# Patient Record
Sex: Female | Born: 1990 | Race: White | Hispanic: No | Marital: Single | Smoking: Former smoker
Health system: Southern US, Community
[De-identification: ages and names within clinical notes are randomized; demographics above are authoritative.]

## PROBLEM LIST (undated history)

## (undated) DIAGNOSIS — D649 Anemia, unspecified: Secondary | ICD-10-CM

## (undated) DIAGNOSIS — F329 Major depressive disorder, single episode, unspecified: Secondary | ICD-10-CM

## (undated) DIAGNOSIS — K219 Gastro-esophageal reflux disease without esophagitis: Secondary | ICD-10-CM

## (undated) DIAGNOSIS — R87629 Unspecified abnormal cytological findings in specimens from vagina: Secondary | ICD-10-CM

## (undated) DIAGNOSIS — F32A Depression, unspecified: Secondary | ICD-10-CM

## (undated) DIAGNOSIS — F419 Anxiety disorder, unspecified: Secondary | ICD-10-CM

## (undated) HISTORY — PX: COLONOSCOPY: SHX174

## (undated) HISTORY — DX: Unspecified abnormal cytological findings in specimens from vagina: R87.629

## (undated) HISTORY — PX: WISDOM TOOTH EXTRACTION: SHX21

## (undated) HISTORY — PX: ESOPHAGOGASTRODUODENOSCOPY: SHX1529

---
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
## 2008-07-08 ENCOUNTER — Emergency Department: Payer: Self-pay | Admitting: Emergency Medicine

## 2010-03-30 ENCOUNTER — Emergency Department: Payer: Self-pay | Admitting: Emergency Medicine

## 2010-04-04 ENCOUNTER — Ambulatory Visit: Payer: Self-pay | Admitting: Obstetrics and Gynecology

## 2010-04-09 LAB — PATHOLOGY REPORT

## 2010-09-24 ENCOUNTER — Emergency Department: Payer: Self-pay | Admitting: Internal Medicine

## 2011-07-15 ENCOUNTER — Observation Stay: Payer: Self-pay

## 2011-07-21 ENCOUNTER — Observation Stay: Payer: Self-pay | Admitting: Obstetrics and Gynecology

## 2011-07-31 ENCOUNTER — Inpatient Hospital Stay: Payer: Self-pay | Admitting: Obstetrics and Gynecology

## 2011-07-31 LAB — CBC WITH DIFFERENTIAL/PLATELET
Eosinophil #: 0.2 10*3/uL (ref 0.0–0.7)
Lymphocyte %: 7.7 %
MCH: 32.2 pg (ref 26.0–34.0)
MCV: 93 fL (ref 80–100)
Monocyte #: 0.8 x10 3/mm (ref 0.2–0.9)
Neutrophil #: 16.4 10*3/uL — ABNORMAL HIGH (ref 1.4–6.5)
Platelet: 149 10*3/uL — ABNORMAL LOW (ref 150–440)
RDW: 13 % (ref 11.5–14.5)
WBC: 18.9 10*3/uL — ABNORMAL HIGH (ref 3.6–11.0)

## 2011-08-01 LAB — HEMATOCRIT: HCT: 32.4 % — ABNORMAL LOW (ref 35.0–47.0)

## 2011-12-07 ENCOUNTER — Ambulatory Visit: Payer: Self-pay | Admitting: Family Medicine

## 2013-07-20 ENCOUNTER — Ambulatory Visit: Payer: Self-pay | Admitting: Orthopedic Surgery

## 2014-07-31 NOTE — H&P (Signed)
L&D Evaluation:  History:   HPI 24 yo G2P0010 @ 36wks EDC 08/17/11 by 7wk uls presents with contractions.  She has been having them for several days but became stronger and closer together last night.  Yesterday morning they were q 30 min.  On arrival they were q 20 min per pt.  Denies LOF/bleeding.  +FM.  PNC @ [facility] c/b placenta previa that resolved.  Seen by Dr. Janene HarveyKlett 1 day ago for routine OB visit, cvx 3/80/-2.    Presents with contractions    Patient's Medical History No Chronic Illness    Patient's Surgical History D&C    Medications Pre Natal Vitamins    Allergies PCN, Latex    Social History tobacco    Family History DM - GM, HTN - father   ROS:   ROS see HPI, all others reviewed and neg   Exam:   Vital Signs stable    Urine Protein not completed    General no apparent distress    Mental Status clear    Chest clear    Heart normal sinus rhythm    Abdomen gravid, non-tender    Edema no edema    Pelvic 4.5/80/0    Mebranes Intact    FHT normal rate with no decels, reactive    Fetal Heart Rate 120     Ucx regular, q 2-3 min    Skin dry   Impression:   Impression PTL, at 36wks   Plan:   Plan monitor contractions and for cervical change, antibiotics for GBBS prophylaxis    Comments 1)  GBS collected in the office today and is pending.  Will start abx since she is changing her cervix. 2)  Monitor for ctxs and cervical change.  She understands that, at her gestation, labor will not be stopped but will not be augmented either.  If she does show definit signs of progressing to labor, will have nursery staff or neonatologist discuss risks of prematurity with her.   Electronic Signatures: Senaida LangeWeaver-Lee, Omar Orrego (MD)  (Signed 01-May-13 01:20)  Authored: L&D Evaluation   Last Updated: 01-May-13 01:20 by Senaida LangeWeaver-Lee, Jahmir Salo (MD)

## 2014-07-31 NOTE — H&P (Signed)
L&D Evaluation:  History:   HPI 24 year old G2 p0010 with EDC=08/17/2011 by LMP=11/10/2010 (and confirmed with 7 wk US) presents at 8434 6/7 weeks with spotting after being seen in office earlier for pelvic pain/thigh pain. She had a cx exam at that time (1/50%), then noticed some pink then brown blood when wiping primarily. Baby has been active. Deneis ctxs or LOF, but has just been having soreness in her pelvic area/saddle area, which has been worsening.  [facility] for conceiving on the pill, beginning care in first trimester, spotting in the first trimester, a normal anatomy scan, placenta previa at 20 weeks which has resolved, and mildly decreased plt count (136K at 28 weeks). A POS    Presents with spotting after a cervical check.    Patient's Medical History No Chronic Illness    Patient's Surgical History D&C  incomplete AB 03/2010    Medications Pre Natal Vitamins    Allergies PCN, other, Latex    Social History tobacco  1/2 PPD    Family History Non-Contributory   ROS:   ROS see HPI   Exam:   Vital Signs stable    Urine Protein not completed    General no apparent distress    Mental Status clear    Abdomen gravid, non-tender    Estimated Fetal Weight Average for gestational age    Fetal Position cephalic    Pelvic no blood on pad under her; cx exam not repeated    Mebranes Intact    FHT normal rate with no decels, nicely reactive strip with baseline at 135 and accels to 170s to 180, moderate variability    Fetal Heart Rate 140    Ucx absent    Skin dry   Impression:   Impression IUP at 34 6/7 weeks with post cx exam spotting   Plan:   Plan Dc home. Discussed comfort measures for muscular type pain. Recommend maternity support garment. May need to start maternity leave if unable to do job due to pain. FU on 29 April at office as scheduled or sooner prn.   Electronic Signatures: Trinna BalloonGutierrez, Kaloni Bisaillon L (CNM)  (Signed 24-Apr-13  14:57)  Authored: L&D Evaluation   Last Updated: 24-Apr-13 14:57 by Trinna BalloonGutierrez, Nihira Puello L (CNM)

## 2014-07-31 NOTE — H&P (Signed)
L&D Evaluation:  History Expanded:   HPI 24 yo G2P0010 whose EDC = 5/27.  Pt presents with SROM at 5 am today    Blood Type A positive    Maternal HIV Negative    Maternal Syphilis Ab Nonreactive    Maternal Varicella Immune    Rubella Results immune    Presents with leaking fluid    Patient's Medical History No Chronic Illness    Patient's Surgical History none    Medications Pre Natal Vitamins  Iron    Allergies PCN, rash    Social History tobacco   Exam:   Vital Signs stable    General no apparent distress    Mental Status clear    Chest clear    Heart normal sinus rhythm    Abdomen gravid, non-tender    Back no CVAT    Edema no edema    Pelvic SROM    Mebranes Ruptured, clear    Description clear    FHT normal rate with no decels   Impression:   Impression IUP. 38 weeks, SROM   Electronic Signatures: Towana Badgerosenow, Luverta Korte J (MD)  (Signed 10-May-13 11:03)  Authored: L&D Evaluation   Last Updated: 10-May-13 11:03 by Towana Badgerosenow, Batoul Limes J (MD)

## 2017-08-10 LAB — HM HIV SCREENING LAB: HM HIV Screening: NEGATIVE

## 2017-08-10 LAB — HM PAP SMEAR

## 2017-09-15 ENCOUNTER — Encounter (INDEPENDENT_AMBULATORY_CARE_PROVIDER_SITE_OTHER): Payer: Self-pay

## 2017-09-15 ENCOUNTER — Other Ambulatory Visit: Payer: Self-pay

## 2017-09-15 ENCOUNTER — Ambulatory Visit: Payer: Self-pay | Attending: Oncology

## 2017-09-15 VITALS — BP 115/77 | HR 51 | Temp 96.6°F | Ht 65.0 in | Wt 177.0 lb

## 2017-09-15 DIAGNOSIS — R87613 High grade squamous intraepithelial lesion on cytologic smear of cervix (HGSIL): Secondary | ICD-10-CM

## 2017-09-15 NOTE — Progress Notes (Signed)
Patient referred to [facility] for abnormal pap results of HSIL.  No HPV testing performed.  Reviewed pap results with patient.  Joellyn Quailshristy Burton to schedule patient for [facility].  Will follow per B[facility]CP guidelines.

## 2017-09-21 ENCOUNTER — Ambulatory Visit (INDEPENDENT_AMBULATORY_CARE_PROVIDER_SITE_OTHER): Payer: Self-pay | Admitting: Obstetrics and Gynecology

## 2017-09-21 ENCOUNTER — Encounter: Payer: Self-pay | Admitting: Obstetrics and Gynecology

## 2017-09-21 VITALS — BP 106/72 | HR 64 | Ht 69.0 in | Wt 183.0 lb

## 2017-09-21 DIAGNOSIS — R87613 High grade squamous intraepithelial lesion on cytologic smear of cervix (HGSIL): Secondary | ICD-10-CM

## 2017-09-21 NOTE — Progress Notes (Signed)
   GYNECOLOGY CLINIC COLPOSCOPY PROCEDURE NOTE  27 y.o. [id number] here for colposcopy for high-grade squamous intraepithelial neoplasia  (HGSIL-encompassing moderate and severe dysplasia)  pap smear in May of 2019. Discussed underlying role for HPV infection in the development of cervical dysplasia, its natural history and progression/regression, need for surveillance.  Is the patient  pregnant: No LMP: Patient's last menstrual period was 09/13/2017 (exact date). Smoking status:  reports that she has been smoking cigarettes.  She has never used smokeless tobacco. Contraception: Nexplanon Future fertility desired:  Yes  Patient given informed consent, signed copy in the chart, time out was performed.  The patient was position in dorsal lithotomy position. Speculum was placed the cervix was visualized.   After application of acetic acid colposcopic inspection of the cervix was undertaken.   Colposcopy adequate, full visualization of transformation zone: Yes Entirety of cervix appeared abnormal. Mosaicism and aceto whit plaque noted at 4 o'clock; corresponding biopsies obtained at 12, 4 and 10 o'clock .   ECC specimen obtained:  Yes  All specimens were labeled and sent to pathology.   Patient was given post procedure instructions.  Will follow up pathology and manage accordingly.  Routine preventative health maintenance measures emphasized.  Physical Exam  Genitourinary:     Encouraged smoking cessation. Patient was given resources by the [facility].   Adelene Idlerhristanna Schuman MD [facility], [facility] 09/21/17 12:16 PM

## 2017-09-21 NOTE — Patient Instructions (Signed)
Colposcopy, Care After  This sheet gives you information about how to care for yourself after your procedure. Your doctor may also give you more specific instructions. If you have problems or questions, contact your doctor.  What can I expect after the procedure?  If you did not have a tissue sample removed (did not have a biopsy), you may only have some spotting for a few days. You can go back to your normal activities.  If you had a tissue sample removed, it is common to have:  · Soreness and pain. This may last for a few days.  · Light-headedness.  · Mild bleeding from your vagina or dark-colored, grainy discharge from your vagina. This may last for a few days. You may need to wear a sanitary pad.  · Spotting for at least 48 hours after the procedure.    Follow these instructions at home:  · Take over-the-counter and prescription medicines only as told by your doctor. Ask your doctor what medicines you can start taking again. This is very important if you take blood-thinning medicine.  · Do not drive or use heavy machinery while taking prescription pain medicine.  · For 3 days, or as long as your doctor tells you, avoid:  ? Douching.  ? Using tampons.  ? Having sex.  · If you use birth control (contraception), keep using it.  · Limit activity for the first day after the procedure. Ask your doctor what activities are safe for you.  · It is up to you to get the results of your procedure. Ask your doctor when your results will be ready.  · Keep all follow-up visits as told by your doctor. This is important.  Contact a doctor if:  · You get a skin rash.  Get help right away if:  · You are bleeding a lot from your vagina. It is a lot of bleeding if you are using more than one pad an hour for 2 hours in a row.  · You have clumps of blood (blood clots) coming from your vagina.  · You have a fever.  · You have chills  · You have pain in your lower belly (pelvic area).  · You have signs of infection, such as vaginal  discharge that is:  ? Different than usual.  ? Yellow.  ? Bad-smelling.  · You have very pain or cramps in your lower belly that do not get better with medicine.  · You feel light-headed.  · You feel dizzy.  · You pass out (faint).  Summary  · If you did not have a tissue sample removed (did not have a biopsy), you may only have some spotting for a few days. You can go back to your normal activities.  · If you had a tissue sample removed, it is common to have mild pain and spotting for 48 hours.  · For 3 days, or as long as your doctor tells you, avoid douching, using tampons and having sex.  · Get help right away if you have bleeding, very bad pain, or signs of infection.  This information is not intended to replace advice given to you by your health care provider. Make sure you discuss any questions you have with your health care provider.  Document Released: 08/26/2007 Document Revised: 11/27/2015 Document Reviewed: 11/27/2015  Elsevier Interactive Patient Education © 2018 Elsevier Inc.

## 2017-09-24 ENCOUNTER — Telehealth: Payer: Self-pay

## 2017-09-24 NOTE — Telephone Encounter (Signed)
I called her at 2:30 PM, but she did not answer. If she call again I would be happy to speak with her.  Adelene Idlerhristanna Schuman MD [facility] 09/24/17 2:29 PM

## 2017-09-24 NOTE — Telephone Encounter (Signed)
Pt called triage line, reporting something strange coming out of her, she said she had a colpo 3 days ago. What she described sounded like the monsels . She is aware that was normal, but she is concerned about the pain she still has, It was so bad yesterday she had to leave work. States ibuprofen is not helping the pain. Please advise

## 2017-09-27 LAB — PATHOLOGY

## 2017-09-29 ENCOUNTER — Telehealth: Payer: Self-pay

## 2017-09-29 NOTE — Telephone Encounter (Signed)
Phoned patient to schedule appointment to [facility].  She is coming 09/30/17 at 2:30 to complete paperwork.  Notified Marella [facility]ileNancy Cohen at [facility].

## 2017-09-29 NOTE — Progress Notes (Signed)
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 Discussed with patient on the phone. She would like to have a Cone instead of a LEEP. She does desire a future pregnancy. Discussed risks with pregnancy after procedure such as a short cervix and preterm birth.

## 2017-10-04 ENCOUNTER — Telehealth: Payer: Self-pay | Admitting: Obstetrics and Gynecology

## 2017-10-04 NOTE — Telephone Encounter (Signed)
-----   Message from Natale Milchhristanna R Schuman, MD sent at 09/29/2017  1:02 PM EDT ----- Surgery Booking Request Patient Full Name:  Melissa Glover  MRN: [id number]  DOB: [birth year]  Surgeon: Natale Milchhristanna R Schuman, MD  Requested Surgery Date and Time: July/August 2019 Primary Diagnosis AND Code: CIN 3 Secondary Diagnosis and Code:  Surgical Procedure: Cold knife cone L&D Notification: No Admission Status: same day surgery Length of Surgery: 1 hour Special Case Needs: none H&P: can do at the hospital (date) Phone Interview???: yes Interpreter: Language:  Medical Clearance: no Special Scheduling Instructions: BCCCP patient

## 2017-10-04 NOTE — Telephone Encounter (Signed)
Patient is aware of H&P day of surgery, Pre-admit Testing phone interview to be scheduled (patient will watch for this in MyChart), and OR on 11/11/17. Ext given.

## 2017-10-05 NOTE — Telephone Encounter (Signed)
Thank you, order in.

## 2017-11-04 ENCOUNTER — Other Ambulatory Visit: Payer: Self-pay

## 2017-11-04 ENCOUNTER — Encounter
Admission: RE | Admit: 2017-11-04 | Discharge: 2017-11-04 | Disposition: A | Discharge status: Home / Self Care | Payer: Medicaid Other | Source: Ambulatory Visit | Attending: Obstetrics and Gynecology | Admitting: Obstetrics and Gynecology

## 2017-11-04 DIAGNOSIS — Z01812 Encounter for preprocedural laboratory examination: Secondary | ICD-10-CM | POA: Insufficient documentation

## 2017-11-04 HISTORY — DX: Anemia, unspecified: D64.9

## 2017-11-04 HISTORY — DX: Major depressive disorder, single episode, unspecified: F32.9

## 2017-11-04 HISTORY — DX: Gastro-esophageal reflux disease without esophagitis: K21.9

## 2017-11-04 HISTORY — DX: Anxiety disorder, unspecified: F41.9

## 2017-11-04 HISTORY — DX: Depression, unspecified: F32.A

## 2017-11-04 LAB — TYPE AND SCREEN
ABO/RH(D): A NEG
ANTIBODY SCREEN: NEGATIVE

## 2017-11-04 LAB — CBC
HCT: 40.8 % (ref 35.0–47.0)
Hemoglobin: 14.2 g/dL (ref 12.0–16.0)
MCH: 32.8 pg (ref 26.0–34.0)
MCHC: 34.9 g/dL (ref 32.0–36.0)
MCV: 94.2 fL (ref 80.0–100.0)
PLATELETS: 109 10*3/uL — AB (ref 150–440)
RBC: 4.34 MIL/uL (ref 3.80–5.20)
RDW: 13.5 % (ref 11.5–14.5)
WBC: 7.7 10*3/uL (ref 3.6–11.0)

## 2017-11-04 NOTE — Patient Instructions (Signed)
Your procedure is scheduled on: 11-11-17 THURSDAY Report to Same Day Surgery 2nd floor medical mall [facility](Medical Mall Entrance-take elevator on left to 2nd floor.  Check in with surgery information desk.) To find out your arrival time please call [phone] between 1PM - 3PM on [facility]  Remember: Instructions that are not followed completely may result in serious medical risk, up to and including death, or upon the discretion of your surgeon and anesthesiologist your surgery may need to be rescheduled.    _x___ 1. Do not eat food after midnight the night before your procedure. NO GUM OR CANDY AFTER MIDNIGHT.  You may drink clear liquids up to 2 hours before you are scheduled to arrive at the hospital for your procedure.  Do not drink clear liquids within 2 hours of your scheduled arrival to the hospital.  Clear liquids include  --Water or Apple juice without pulp  --Clear carbohydrate beverage such as ClearFast or Gatorade  --Black Coffee or Clear Tea (No milk, no creamers, do not add anything to the coffee or Tea  ____Ensure clear carbohydrate drink on the way to the hospital for bariatric patients  ____Ensure clear carbohydrate drink 3 hours before surgery for Dr Rutherford NailByrnett's patients if physician instructed.     __x__ 2. No Alcohol for 24 hours before or after surgery.   __x__3. No Smoking or e-cigarettes for 24 prior to surgery.  Do not use any chewable tobacco products for at least 6 hour prior to surgery   ____  4. Bring all medications with you on the day of surgery if instructed.    __x__ 5. Notify your doctor if there is any change in your medical condition     (cold, fever, infections).    x___6. On the morning of surgery brush your teeth with toothpaste and water.  You may rinse your mouth with mouth wash if you wish.  Do not swallow any toothpaste or mouthwash.   Do not wear jewelry, make-up, hairpins, clips or nail polish.  Do not wear lotions, powders, or perfumes.  You may wear deodorant.  Do not shave 48 hours prior to surgery. Men may shave face and neck.  Do not bring valuables to the hospital.    [facility] is not responsible for any belongings or valuables.               Contacts, dentures or bridgework may not be worn into surgery.  Leave your suitcase in the car. After surgery it may be brought to your room.  For patients admitted to the hospital, discharge time is determined by your treatment team.  _  Patients discharged the day of surgery will not be allowed to drive home.  You will need someone to drive you home and stay with you the night of your procedure.    Please read over the following fact sheets that you were given:   [facility] Preparing for [facility]   ____ Take anti-hypertensive listed below, cardiac, seizure, asthma, anti-reflux and psychiatric medicines. These include:  1. NONE  2.  3.  4.  5.  6.  ____Fleets enema or Magnesium Citrate as directed.   ____ Use CHG Soap or sage wipes as directed on instruction sheet   ____ Use inhalers on the day of surgery and bring to hospital day of surgery  ____ Stop Metformin and Janumet 2 days prior to surgery.    ____ Take 1/2 of usual insulin dose the night before surgery and none on  the morning surgery.   ____ Follow recommendations from Cardiologist, Pulmonologist or PCP regarding stopping Aspirin, Coumadin, Plavix ,Eliquis, Effient, or Pradaxa, and Pletal.  X____Stop Anti-inflammatories such as Advil, Aleve, Ibuprofen, Motrin, Naproxen, Naprosyn, Goodies powders or aspirin products NOW-OK to take Tylenol    ____ Stop supplements until after surgery.     ____ Bring C-Pap to the hospital.

## 2017-11-05 ENCOUNTER — Telehealth: Payer: Self-pay

## 2017-11-05 NOTE — Telephone Encounter (Signed)
Pt is scheduled for CONE procedure next week. Pt is inquiring how long she will be out of work. She needs to let them know. [phone]

## 2017-11-09 NOTE — Telephone Encounter (Signed)
I called and discussed this with Mya.  Thank you,Dr. Jerene PitchSchuman

## 2017-11-11 ENCOUNTER — Encounter
Admission: RE | Disposition: A | Discharge status: Home / Self Care | Payer: Self-pay | Source: Ambulatory Visit | Attending: Obstetrics and Gynecology

## 2017-11-11 ENCOUNTER — Ambulatory Visit: Payer: Medicaid Other | Admitting: Anesthesiology

## 2017-11-11 ENCOUNTER — Other Ambulatory Visit: Payer: Self-pay

## 2017-11-11 ENCOUNTER — Encounter: Payer: Self-pay | Admitting: *Deleted

## 2017-11-11 ENCOUNTER — Ambulatory Visit
Admission: RE | Admit: 2017-11-11 | Discharge: 2017-11-11 | Disposition: A | Discharge status: Home / Self Care | Payer: Medicaid Other | Source: Ambulatory Visit | Attending: Obstetrics and Gynecology | Admitting: Obstetrics and Gynecology

## 2017-11-11 DIAGNOSIS — K219 Gastro-esophageal reflux disease without esophagitis: Secondary | ICD-10-CM | POA: Diagnosis not present

## 2017-11-11 DIAGNOSIS — D649 Anemia, unspecified: Secondary | ICD-10-CM | POA: Diagnosis not present

## 2017-11-11 DIAGNOSIS — F419 Anxiety disorder, unspecified: Secondary | ICD-10-CM | POA: Insufficient documentation

## 2017-11-11 DIAGNOSIS — Z91040 Latex allergy status: Secondary | ICD-10-CM | POA: Insufficient documentation

## 2017-11-11 DIAGNOSIS — F329 Major depressive disorder, single episode, unspecified: Secondary | ICD-10-CM | POA: Insufficient documentation

## 2017-11-11 DIAGNOSIS — N879 Dysplasia of cervix uteri, unspecified: Secondary | ICD-10-CM | POA: Diagnosis present

## 2017-11-11 DIAGNOSIS — D069 Carcinoma in situ of cervix, unspecified: Secondary | ICD-10-CM | POA: Insufficient documentation

## 2017-11-11 DIAGNOSIS — Z880 Allergy status to penicillin: Secondary | ICD-10-CM | POA: Insufficient documentation

## 2017-11-11 DIAGNOSIS — D061 Carcinoma in situ of exocervix: Secondary | ICD-10-CM

## 2017-11-11 DIAGNOSIS — F17200 Nicotine dependence, unspecified, uncomplicated: Secondary | ICD-10-CM | POA: Diagnosis not present

## 2017-11-11 HISTORY — PX: CERVICAL CONIZATION W/BX: SHX1330

## 2017-11-11 LAB — URINE DRUG SCREEN, QUALITATIVE (ARMC ONLY)
AMPHETAMINES, UR SCREEN: NOT DETECTED
Barbiturates, Ur Screen: NOT DETECTED
CANNABINOID 50 NG, UR ~~LOC~~: POSITIVE — AB
COCAINE METABOLITE, UR ~~LOC~~: NOT DETECTED
MDMA (ECSTASY) UR SCREEN: NOT DETECTED
Methadone Scn, Ur: NOT DETECTED
OPIATE, UR SCREEN: NOT DETECTED
Phencyclidine (PCP) Ur S: NOT DETECTED
Tricyclic, Ur Screen: NOT DETECTED

## 2017-11-11 LAB — ABO/RH: ABO/RH(D): A NEG

## 2017-11-11 LAB — POCT PREGNANCY, URINE: PREG TEST UR: NEGATIVE

## 2017-11-11 SURGERY — CONE BIOPSY, CERVIX
Anesthesia: General

## 2017-11-11 MED ORDER — KETOROLAC TROMETHAMINE 30 MG/ML IJ SOLN
INTRAMUSCULAR | Status: DC | PRN
  Administered 2017-11-11: 30 mg via INTRAVENOUS

## 2017-11-11 MED ORDER — GLYCOPYRROLATE 0.2 MG/ML IJ SOLN
INTRAMUSCULAR | Status: DC | PRN
  Administered 2017-11-11: 0.1 mg via INTRAVENOUS

## 2017-11-11 MED ORDER — ACETAMINOPHEN 10 MG/ML IV SOLN
INTRAVENOUS | Status: DC | PRN
  Administered 2017-11-11: 1000 mg via INTRAVENOUS

## 2017-11-11 MED ORDER — FAMOTIDINE 20 MG PO TABS
20.0000 mg | ORAL_TABLET | Freq: Once | ORAL | Status: AC
  Administered 2017-11-11: 20 mg via ORAL

## 2017-11-11 MED ORDER — HYDROMORPHONE HCL 1 MG/ML IJ SOLN
INTRAMUSCULAR | Status: AC
  Filled 2017-11-11: qty 1

## 2017-11-11 MED ORDER — OXYCODONE HCL 5 MG/5ML PO SOLN: 5.0000 mg | Freq: Once | ORAL | Status: AC | PRN

## 2017-11-11 MED ORDER — LIDOCAINE-EPINEPHRINE 1 %-1:100000 IJ SOLN
INTRAMUSCULAR | Status: AC
  Filled 2017-11-11: qty 3

## 2017-11-11 MED ORDER — DEXAMETHASONE SODIUM PHOSPHATE 10 MG/ML IJ SOLN
INTRAMUSCULAR | Status: DC | PRN
  Administered 2017-11-11: 10 mg via INTRAVENOUS

## 2017-11-11 MED ORDER — ACETAMINOPHEN NICU IV SYRINGE 10 MG/ML
INTRAVENOUS | Status: AC
  Filled 2017-11-11: qty 1

## 2017-11-11 MED ORDER — ACETIC ACID 3 % SOLN
Status: AC
  Filled 2017-11-11: qty 500

## 2017-11-11 MED ORDER — FENTANYL CITRATE (PF) 100 MCG/2ML IJ SOLN
INTRAMUSCULAR | Status: AC
  Filled 2017-11-11: qty 2

## 2017-11-11 MED ORDER — LIDOCAINE HCL (CARDIAC) PF 100 MG/5ML IV SOSY
PREFILLED_SYRINGE | INTRAVENOUS | Status: DC | PRN
  Administered 2017-11-11: 100 mg via INTRAVENOUS

## 2017-11-11 MED ORDER — HYDROMORPHONE HCL 1 MG/ML IJ SOLN
INTRAMUSCULAR | Status: DC | PRN
  Administered 2017-11-11: 0.5 mg via INTRAVENOUS
  Administered 2017-11-11: 1 mg via INTRAVENOUS
  Administered 2017-11-11: 0.5 mg via INTRAVENOUS

## 2017-11-11 MED ORDER — LACTATED RINGERS IV SOLN
INTRAVENOUS | Status: DC
  Administered 2017-11-11 (×2): via INTRAVENOUS

## 2017-11-11 MED ORDER — OXYCODONE HCL 5 MG PO TABS
5.0000 mg | ORAL_TABLET | Freq: Once | ORAL | Status: AC | PRN
  Administered 2017-11-11: 5 mg via ORAL

## 2017-11-11 MED ORDER — OXYCODONE HCL 5 MG PO TABS
ORAL_TABLET | ORAL | Status: AC
  Filled 2017-11-11: qty 1

## 2017-11-11 MED ORDER — PROPOFOL 10 MG/ML IV BOLUS
INTRAVENOUS | Status: DC | PRN
  Administered 2017-11-11: 200 mg via INTRAVENOUS

## 2017-11-11 MED ORDER — KETOROLAC TROMETHAMINE 30 MG/ML IJ SOLN
INTRAMUSCULAR | Status: AC
  Filled 2017-11-11: qty 1

## 2017-11-11 MED ORDER — ACETIC ACID 4% SOLUTION
Status: DC | PRN
  Administered 2017-11-11: 1 via TOPICAL

## 2017-11-11 MED ORDER — FERRIC SUBSULFATE 259 MG/GM EX SOLN
CUTANEOUS | Status: AC
  Filled 2017-11-11: qty 8

## 2017-11-11 MED ORDER — LIDOCAINE HCL URETHRAL/MUCOSAL 2 % EX GEL
CUTANEOUS | Status: AC
  Filled 2017-11-11: qty 5

## 2017-11-11 MED ORDER — LIDOCAINE-EPINEPHRINE 1 %-1:100000 IJ SOLN
INTRAMUSCULAR | Status: DC | PRN
  Administered 2017-11-11: 30 mL

## 2017-11-11 MED ORDER — IODINE STRONG (LUGOLS) 5 % PO SOLN
ORAL | Status: AC
  Filled 2017-11-11: qty 1

## 2017-11-11 MED ORDER — ONDANSETRON HCL 4 MG/2ML IJ SOLN
INTRAMUSCULAR | Status: DC | PRN
  Administered 2017-11-11: 4 mg via INTRAVENOUS

## 2017-11-11 MED ORDER — IODINE STRONG (LUGOLS) 5 % PO SOLN
ORAL | Status: DC | PRN
  Administered 2017-11-11: 3 mL

## 2017-11-11 MED ORDER — MIDAZOLAM HCL 5 MG/5ML IJ SOLN
INTRAMUSCULAR | Status: AC
  Filled 2017-11-11: qty 5

## 2017-11-11 MED ORDER — LACTATED RINGERS IV SOLN: INTRAVENOUS | Status: DC

## 2017-11-11 MED ORDER — HYDROCODONE-ACETAMINOPHEN 5-325 MG PO TABS: 1.0000 | ORAL_TABLET | Freq: Four times a day (QID) | ORAL | 0 refills | Status: AC | PRN

## 2017-11-11 MED ORDER — PROPOFOL 10 MG/ML IV BOLUS
INTRAVENOUS | Status: AC
  Filled 2017-11-11: qty 20

## 2017-11-11 MED ORDER — FENTANYL CITRATE (PF) 100 MCG/2ML IJ SOLN
25.0000 ug | INTRAMUSCULAR | Status: DC | PRN
  Administered 2017-11-11 (×2): 50 ug via INTRAVENOUS

## 2017-11-11 MED ORDER — FAMOTIDINE 20 MG PO TABS
ORAL_TABLET | ORAL | Status: AC
  Administered 2017-11-11: 20 mg via ORAL
  Filled 2017-11-11: qty 1

## 2017-11-11 MED ORDER — MIDAZOLAM HCL 2 MG/2ML IJ SOLN
INTRAMUSCULAR | Status: DC | PRN
  Administered 2017-11-11: 3 mg via INTRAVENOUS
  Administered 2017-11-11: 2 mg via INTRAVENOUS

## 2017-11-11 SURGICAL SUPPLY — 33 items
APPLICATOR COTTON TIP 6IN STRL (MISCELLANEOUS) ×3 IMPLANT
APPLICATOR SWAB PROCTO LG 16IN (MISCELLANEOUS) ×3 IMPLANT
BLADE SURG SZ11 CARB STEEL (BLADE) ×6 IMPLANT
CANISTER SUCT 1200ML W/VALVE (MISCELLANEOUS) ×3 IMPLANT
CATH ROBINSON RED A/P 16FR (CATHETERS) ×3 IMPLANT
CNTNR SPEC 2.5X3XGRAD LEK (MISCELLANEOUS) ×1
CONT SPEC 4OZ STER OR WHT (MISCELLANEOUS) ×2
CONTAINER SPEC 2.5X3XGRAD LEK (MISCELLANEOUS) ×1 IMPLANT
DRAPE PERI LITHO V/GYN (MISCELLANEOUS) ×3 IMPLANT
DRSG TELFA 3X8 NADH (GAUZE/BANDAGES/DRESSINGS) ×3 IMPLANT
ELECT LEEP BALL 5MM 12CM (MISCELLANEOUS)
ELECT REM PT RETURN 9FT ADLT (ELECTROSURGICAL) ×3
ELECTRODE LEEP BALL 5MM 12CM (MISCELLANEOUS) IMPLANT
ELECTRODE REM PT RTRN 9FT ADLT (ELECTROSURGICAL) ×1 IMPLANT
GLOVE BIOGEL PI IND STRL 6.5 (GLOVE) ×1 IMPLANT
GLOVE BIOGEL PI INDICATOR 6.5 (GLOVE) ×2
GLOVE SURG SYN 6.5 ES PF (GLOVE) ×3 IMPLANT
GOWN STRL REUS W/ TWL LRG LVL3 (GOWN DISPOSABLE) ×2 IMPLANT
GOWN STRL REUS W/TWL LRG LVL3 (GOWN DISPOSABLE) ×4
KIT TURNOVER CYSTO (KITS) ×3 IMPLANT
NEEDLE SPNL 22GX3.5 QUINCKE BK (NEEDLE) ×3 IMPLANT
NS IRRIG 500ML POUR BTL (IV SOLUTION) ×3 IMPLANT
PACK BASIN MINOR ARMC (MISCELLANEOUS) ×3 IMPLANT
PAD OB MATERNITY 4.3X12.25 (PERSONAL CARE ITEMS) ×3 IMPLANT
PAD PREP 24X41 OB/GYN DISP (PERSONAL CARE ITEMS) ×3 IMPLANT
SCRUB POVIDONE IODINE 4 OZ (MISCELLANEOUS) ×3 IMPLANT
SUT ETHILON 3-0 FS-10 30 BLK (SUTURE) ×3
SUT VIC AB 0 CT1 36 (SUTURE) ×12 IMPLANT
SUT VIC AB 2-0 CT1 27 (SUTURE) ×4
SUT VIC AB 2-0 CT1 TAPERPNT 27 (SUTURE) ×2 IMPLANT
SUTURE EHLN 3-0 FS-10 30 BLK (SUTURE) ×1 IMPLANT
SYR 10ML LL (SYRINGE) ×3 IMPLANT
SYR CONTROL 10ML (SYRINGE) ×3 IMPLANT

## 2017-11-11 NOTE — Transfer of Care (Signed)
Immediate Anesthesia Transfer of Care Note  Patient: Melissa Glover  Procedure(s) Performed: CONIZATION CERVIX WITH BIOPSY- COLD KNIFE (N/A )  Patient Location: PACU  Anesthesia Type:General  Level of Consciousness: drowsy and patient cooperative  Airway & Oxygen Therapy: Patient Spontanous Breathing and Patient connected to face mask oxygen  Post-op Assessment: Report given to RN, Post -op Vital signs reviewed and stable and Patient moving all extremities  Post vital signs: Reviewed and stable  Last Vitals:  Vitals Value Taken Time  BP 100/59 11/11/2017  1:24 PM  Temp 36.6 C 11/11/2017  1:23 PM  Pulse 48 11/11/2017  1:26 PM  Resp 10 11/11/2017  1:26 PM  SpO2 100 % 11/11/2017  1:26 PM  Vitals shown include unvalidated device data.  Last Pain:  Vitals:   11/11/17 1323  TempSrc:   PainSc: (P) 0-No pain         Complications: No apparent anesthesia complications

## 2017-11-11 NOTE — Anesthesia Post-op Follow-up Note (Signed)
Anesthesia QCDR form completed.        

## 2017-11-11 NOTE — H&P (Signed)
History and Physical   Melissa Glover is an 27 y.o. female.  HPI: She is presenting today for a cervical cold knife cone. She had an abnormal pap smear on 08/10/17 that was HSIL. 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 These results were discussed with the patient and she elected to undergo a cervical conization.  Past Medical History:  Diagnosis Date  . Anemia   . Anxiety    NO MEDS  . Depression    NO MEDS  . GERD (gastroesophageal reflux disease)     Past Surgical History:  Procedure Laterality Date  . COLONOSCOPY    . ESOPHAGOGASTRODUODENOSCOPY    . WISDOM TOOTH EXTRACTION      History reviewed. No pertinent family history.  Social History:  reports that she has been smoking cigarettes. She has a 10.00 pack-year smoking history. She has never used smokeless tobacco. She reports that she has current or past drug history. Drug: Marijuana. She reports that she does not drink alcohol.  Allergies:  Allergies  Allergen Reactions  . Penicillins Hives    Has patient had a PCN reaction causing immediate rash, facial/tongue/throat swelling, SOB or lightheadedness with hypotension: No Has patient had a PCN reaction causing severe rash involving mucus membranes or skin necrosis: Yes Has patient had a PCN reaction that required hospitalization: Yes Has patient had a PCN reaction occurring within the last 10 years: No If all of the above answers are "NO", then may proceed with Cephalosporin use.   . Latex Itching and Rash    Medications: I have reviewed the patient's current medications.  Results for orders placed or performed during the hospital encounter of 11/11/17 (from the past 48 hour(s))  Urine Drug Screen, Qualitative ([facility] only)     Status: Abnormal   Collection Time: 11/11/17 10:09 AM  Result Value Ref Range   Tricyclic, Ur Screen NONE DETECTED NONE DETECTED   Amphetamines, Ur Screen NONE DETECTED NONE  DETECTED   MDMA (Ecstasy)Ur Screen NONE DETECTED NONE DETECTED   Cocaine Metabolite,Ur [place] NONE DETECTED NONE DETECTED   Opiate, Ur Screen NONE DETECTED NONE DETECTED   Phencyclidine (PCP) Ur S NONE DETECTED NONE DETECTED   Cannabinoid 50 Ng, Ur [place] POSITIVE (A) NONE DETECTED   Barbiturates, Ur Screen NONE DETECTED NONE DETECTED   Benzodiazepine, Ur Scrn TEST NOT PERFORMED, REAGENT NOT AVAILABLE (A) NONE DETECTED   Methadone Scn, Ur NONE DETECTED NONE DETECTED    Comment: (NOTE) Tricyclics + metabolites, urine    Cutoff 1000 ng/mL Amphetamines + metabolites, urine  Cutoff 1000 ng/mL MDMA (Ecstasy), urine              Cutoff 500 ng/mL Cocaine Metabolite, urine          Cutoff 300 ng/mL Opiate + metabolites, urine        Cutoff 300 ng/mL Phencyclidine (PCP), urine         Cutoff 25 ng/mL Cannabinoid, urine                 Cutoff 50 ng/mL Barbiturates + metabolites, urine  Cutoff 200 ng/mL Benzodiazepine, urine              Cutoff 200 ng/mL Methadone, urine                   Cutoff 300 ng/mL The urine drug screen provides only a preliminary, unconfirmed analytical test result and should not be used  for non-medical purposes. Clinical consideration and professional judgment should be applied to any positive drug screen result due to possible interfering substances. A more specific alternate chemical method must be used in order to obtain a confirmed analytical result. Gas chromatography / mass spectrometry (GC/MS) is the preferred confirmat ory method. Performed at [facility], [address] [place], [place] [address]   Pregnancy, urine POC     Status: None   Collection Time: 11/11/17 10:09 AM  Result Value Ref Range   Preg Test, Ur NEGATIVE NEGATIVE    Comment:        THE SENSITIVITY OF THIS METHODOLOGY IS >24 mIU/mL   ABO/Rh     Status: None   Collection Time: 11/11/17 10:20 AM  Result Value Ref Range   ABO/RH(D)      A NEG Performed at [facility], [address] [place], [place] [address]     No results found.  Review of Systems  Constitutional: Negative for chills, fever, malaise/fatigue and weight loss.  HENT: Negative for congestion, hearing loss and sinus pain.   Eyes: Negative for blurred vision and double vision.  Respiratory: Negative for cough, sputum production, shortness of breath and wheezing.   Cardiovascular: Negative for chest pain, palpitations, orthopnea and leg swelling.  Gastrointestinal: Negative for abdominal pain, constipation, diarrhea, nausea and vomiting.  Genitourinary: Negative for dysuria, flank pain, frequency, hematuria and urgency.  Musculoskeletal: Negative for back pain, falls and joint pain.  Skin: Negative for itching and rash.  Neurological: Negative for dizziness and headaches.  Psychiatric/Behavioral: Negative for depression, substance abuse and suicidal ideas. The patient is not nervous/anxious.    Pulse 60, temperature 98 F (36.7 C), temperature source Oral, resp. rate 20, height 5\' 9"  (1.753 m), weight 81.6 kg, last menstrual period 10/31/2017, SpO2 100 %. Physical Exam  Nursing note and vitals reviewed. Constitutional: She is oriented to person, place, and time. She appears well-developed and well-nourished.  HENT:  Head: Normocephalic and atraumatic.  Cardiovascular: Normal rate and regular rhythm.  Respiratory: Effort normal and breath sounds normal.  GI: Soft. Bowel sounds are normal.  Musculoskeletal: Normal range of motion.  Neurological: She is alert and oriented to person, place, and time.  Skin: Skin is warm and dry.  Psychiatric: She has a normal mood and affect. Her behavior is normal. Judgment and thought content normal.    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 Will proceed with cervical conization.  Melissa Glover 11/11/2017, 11:33 AM

## 2017-11-11 NOTE — Anesthesia Preprocedure Evaluation (Signed)
Anesthesia Evaluation  Patient identified by MRN, date of birth, ID band Patient awake    Reviewed: Allergy & Precautions, H&P , NPO status , Patient's Chart, lab work & pertinent test results  History of Anesthesia Complications Negative for: history of anesthetic complications  Airway Mallampati: II  TM Distance: >3 FB Neck ROM: full    Dental  (+) Chipped, Poor Dentition   Pulmonary neg shortness of breath, Current Smoker,           Cardiovascular Exercise Tolerance: Good (-) angina(-) Past MI and (-) DOE negative cardio ROS       Neuro/Psych PSYCHIATRIC DISORDERS Anxiety Depression negative neurological ROS     GI/Hepatic Neg liver ROS, GERD  Medicated and Controlled,  Endo/Other  negative endocrine ROS  Renal/GU      Musculoskeletal   Abdominal   Peds  Hematology negative hematology ROS (+)   Anesthesia Other Findings Past Medical History: No date: Anemia No date: Anxiety     Comment:  NO MEDS No date: Depression     Comment:  NO MEDS No date: GERD (gastroesophageal reflux disease)  Past Surgical History: No date: COLONOSCOPY No date: ESOPHAGOGASTRODUODENOSCOPY No date: WISDOM TOOTH EXTRACTION  BMI    Body Mass Index:  26.58 kg/m      Reproductive/Obstetrics negative OB ROS                             Anesthesia Physical Anesthesia Plan  ASA: III  Anesthesia Plan: General LMA   Post-op Pain Management:    Induction: Intravenous  PONV Risk Score and Plan: Ondansetron, Dexamethasone and Midazolam  Airway Management Planned: LMA  Additional Equipment:   Intra-op Plan:   Post-operative Plan: Extubation in OR  Informed Consent: I have reviewed the patients History and Physical, chart, labs and discussed the procedure including the risks, benefits and alternatives for the proposed anesthesia with the patient or authorized representative who has indicated  his/her understanding and acceptance.   Dental Advisory Given  Plan Discussed with: Anesthesiologist, CRNA and Surgeon  Anesthesia Plan Comments: (Patient consented for risks of anesthesia including but not limited to:  - adverse reactions to medications - damage to teeth, lips or other oral mucosa - sore throat or hoarseness - Damage to heart, brain, lungs or loss of life  Patient voiced understanding.)        Anesthesia Quick Evaluation

## 2017-11-11 NOTE — Op Note (Signed)
Operative Note   SURGERY DATE: 11/11/2017  PRE-OP DIAGNOSIS: Cervical Dysplasia    POST-OP DIAGNOSIS: same  PROCEDURE: Exam under anesthesia, cold knife cone , endocervical curettage,   SURGEON:Kalee Broxton MD  ANESTHESIA: Choice   ESTIMATED BLOOD LOSS: 10 cc   SPECIMENS:Cervical cone, post-cone endocervical curettage  COMPLICATIONS: None  INDICATIONS: CIN 3 and CIN 2  FINDINGS: EGBUS and vagina within normal limits. Cervix grossly showed fungated lesion highlighted with acetic acid and lugols solution at 4 o'clock with several satellite lesions along there inferior cervix which were also highlighted by the lugols solution.    PROCEDURE IN DETAIL: After informed consent was obtained, the patient was taken to the operating room where general anesthesia was administered without difficulty. The patient was positioned in the dorsal lithotomy position in [place] stirrups. Time-out was performed. The patient was examined under anesthesia, and the above findings were noted. She was prepped and draped in normal sterile fashion.  A weighted speculum and deaver were placed inside the patient's vagina. The above mentioned findings were noted. The anterior lip of the cervix was grasped with the single tooth tenaculum.    Paracervical block performed with 1% xylocaine with epinephrine, 30cc was used. Two sutures were placed at 3 o'clock and 9 o'clock using 0-vicryl. The cervical conization was then performed to excise the cervix using the 11 blade scalpel. A silk long suture was placed at the 12 o'clock position. A short silk suture was placed at the 3 o'clock position.A post conization ECC was performed. Monsel's solution was applied to the cervix.  All instruments were then removed from the vagina. Excellent hemostasis was obtained at the end of the procedure.    The patient tolerated the procedure well. Sponge, lap, needle, and instrument counts were correct x 2. The patient was transferred to  the recovery room awake, alert and breathing independently in stable condition.  Adelene Idlerhristanna Jaeden Messer MD [facility], [facility] 06/10/17 11:55 AM

## 2017-11-11 NOTE — Anesthesia Procedure Notes (Signed)
Procedure Name: LMA Insertion Date/Time: 11/11/2017 12:23 PM Performed by: Sherol DadeMacMang, Tomisha Reppucci H, CRNA Pre-anesthesia Checklist: Patient identified, Emergency Drugs available, Suction available, Patient being monitored and Timeout performed Patient Re-evaluated:Patient Re-evaluated prior to induction Oxygen Delivery Method: Circle system utilized Preoxygenation: Pre-oxygenation with 100% oxygen Induction Type: IV induction Ventilation: Mask ventilation without difficulty LMA: LMA inserted LMA Size: 3.0 Tube type: Oral Number of attempts: 1 Placement Confirmation: positive ETCO2,  CO2 detector and breath sounds checked- equal and bilateral Tube secured with: Tape Dental Injury: Teeth and Oropharynx as per pre-operative assessment  Comments: Atraumatic seating of AuraStraight LMA.

## 2017-11-11 NOTE — Discharge Instructions (Signed)
AMBULATORY SURGERY  DISCHARGE INSTRUCTIONS   1) The drugs that you were given will stay in your system until tomorrow so for the next 24 hours you should not:  A) Drive an automobile B) Make any legal decisions C) Drink any alcoholic beverage   2) You may resume regular meals tomorrow.  Today it is better to start with liquids and gradually work up to solid foods.  You may eat anything you prefer, but it is better to start with liquids, then soup and crackers, and gradually work up to solid foods.   3) Please notify your doctor immediately if you have any unusual bleeding, trouble breathing, redness and pain at the surgery site, drainage, fever, or pain not relieved by medication.    4) Additional Instructions:        Please contact your physician with any problems or Same Day Surgery at [phone], Monday through Friday 6 am to 4 pm, or [place] at [facility] Main number at [phone].Cervical Conization, Care After This sheet gives you information about how to care for yourself after your procedure. Your doctor may also give you more specific instructions. If you have problems or questions, contact your doctor. Follow these instructions at home: Medicines  Take over-the-counter and prescription medicines only as told by your doctor.  Do not take aspirin until your doctor says it is okay.  If you take pain medicine: ? You may have constipation. To help treat this, your doctor may tell you to:  Drink enough fluid to keep your pee (urine) clear or pale yellow.  Take medicines.  Eat foods that are high in fiber. These include fresh fruits and vegetables, whole grains, bran, and beans.  Limit foods that are high in fat and sugar. These include fried foods and sweet foods. ? Do not drive or use heavy machines. General instructions  You can eat your usual diet unless your doctor tells you not to do so.  Take showers for the first week. Do not take baths, swim, or  use hot tubs until your doctor says it is okay.  Do not douche, use tampons, or have sex until your doctor says it is okay.  For 7-14 days after your procedure, avoid: ? Being very active. ? Exercising. ? Heavy lifting.  Keep all follow-up visits as told by your doctor. This is important. Contact a doctor if:  You have a rash.  You are dizzy or lightheaded.  You feel sick to your stomach (nauseous).  You throw up (vomit).  You have fluid from your vagina (vaginal discharge) that smells bad. Get help right away if:  There are blood clots coming from your vagina.  You have more bleeding than you would have in a normal period. For example, you soak a pad in less than 1 hour.  You have a fever.  You have more and more cramps.  You pass out (faint).  You have pain when peeing.  Your have a lot of pain.  Your pain gets worse.  Your pain does not get better when you take your medicine.  You have blood in your pee.  You throw up (vomit). Summary  After your procedure, take over-the-counter and prescription medicines only as told by your doctor.  Do not douche, use tampons, or have sex until your doctor says it is okay.  For about 7-14 days after your procedure, try not to exercise or lift heavy objects.  Get help right away if you have new symptoms, or  if your symptoms become worse. This information is not intended to replace advice given to you by your health care provider. Make sure you discuss any questions you have with your health care provider. Document Released: 12/17/2007 Document Revised: 03/11/2016 Document Reviewed: 03/11/2016 Elsevier Interactive Patient Education  2017 [employer].

## 2017-11-11 NOTE — Anesthesia Postprocedure Evaluation (Signed)
Anesthesia Post Note  Patient: Melissa Glover  Procedure(s) Performed: CONIZATION CERVIX WITH BIOPSY- COLD KNIFE (N/A )  Patient location during evaluation: PACU Anesthesia Type: General Level of consciousness: awake and alert Pain management: pain level controlled Vital Signs Assessment: post-procedure vital signs reviewed and stable Respiratory status: spontaneous breathing, nonlabored ventilation, respiratory function stable and patient connected to nasal cannula oxygen Cardiovascular status: blood pressure returned to baseline and stable Postop Assessment: no apparent nausea or vomiting Anesthetic complications: no     Last Vitals:  Vitals:   11/11/17 1440 11/11/17 1501  BP: 134/66 117/74  Pulse: (!) 47 (!) 54  Resp: 16 16  Temp: 36.6 C   SpO2: 100% 100%    Last Pain:  Vitals:   11/11/17 1501  TempSrc:   PainSc: 4                  Cleda MccreedyJoseph K Piscitello

## 2017-11-11 NOTE — Progress Notes (Signed)
Pts glasses placed on from family

## 2017-11-12 ENCOUNTER — Encounter: Payer: Self-pay | Admitting: Obstetrics and Gynecology

## 2017-11-14 LAB — SURGICAL PATHOLOGY

## 2017-11-15 NOTE — Progress Notes (Signed)
CIN 3, negative margins, discussed on phone, released to mychart. Will need pap at 12 AND 24 months.

## 2017-11-18 ENCOUNTER — Encounter: Payer: Self-pay | Admitting: Obstetrics and Gynecology

## 2017-11-18 ENCOUNTER — Ambulatory Visit (INDEPENDENT_AMBULATORY_CARE_PROVIDER_SITE_OTHER): Payer: Medicaid Other | Admitting: Obstetrics and Gynecology

## 2017-11-18 VITALS — BP 106/70 | HR 89 | Ht 69.0 in | Wt 177.0 lb

## 2017-11-18 DIAGNOSIS — D060 Carcinoma in situ of endocervix: Secondary | ICD-10-CM | POA: Insufficient documentation

## 2017-11-18 DIAGNOSIS — I96 Gangrene, not elsewhere classified: Secondary | ICD-10-CM

## 2017-11-18 DIAGNOSIS — Z9889 Other specified postprocedural states: Secondary | ICD-10-CM | POA: Insufficient documentation

## 2017-11-18 HISTORY — DX: Carcinoma in situ of endocervix: D06.0

## 2017-11-18 MED ORDER — HYDROCODONE-ACETAMINOPHEN 5-325 MG PO TABS: 1.0000 | ORAL_TABLET | Freq: Four times a day (QID) | ORAL | 0 refills | Status: AC | PRN

## 2017-11-18 NOTE — Progress Notes (Signed)
.   Postoperative Follow-up Patient presents post op from cold knife cone for CIN 3, 1 week ago.  Subjective: Patient reports little improvement in her preop symptoms. Eating a regular diet without difficulty. Activity: sedentary. Patient reports vaginal sx's of a black malodorous discharge. She has been having lower pelvic cramping and pain. No fevers of chills. She had only a small amount for bleeding for 2 days after the procedure.  Objective: BP 106/70   Pulse 89   Ht 5\' 9"  (1.753 m)   Wt 177 lb (80.3 kg)   LMP 10/31/2017 (Exact Date)   BMI 26.14 kg/m  Physical Exam  Constitutional: She is oriented to person, place, and time. She appears well-developed.  Genitourinary: Vagina normal and uterus normal. There is no lesion on the right labia. There is no lesion on the left labia. Vagina exhibits no lesion. Right adnexum does not display mass. Left adnexum does not display mass. Cervix does not exhibit motion tenderness.  Genitourinary Comments: Sutures removed from the 3 and 9 o'clock position using the scissors.  Necrotic tissue at there surgical site. Thin gray discharge with flecks of black tissue.  Small amount of bleeding with removal of the sutures. Tissue around the surgical margin is pink/red normal appearing.   HENT:  Head: Normocephalic and atraumatic.  Eyes: EOM are normal.  Neck: Neck supple. No thyromegaly present.  Cardiovascular: Normal rate, regular rhythm and normal heart sounds.  Pulmonary/Chest: Effort normal and breath sounds normal. Right breast exhibits no inverted nipple, no mass, no nipple discharge and no skin change. Left breast exhibits no inverted nipple, no mass, no nipple discharge and no skin change.  Abdominal: Soft. Bowel sounds are normal. She exhibits no distension and no mass.  Neurological: She is alert and oriented to person, place, and time.  Skin: Skin is warm and dry.  Psychiatric: She has a normal mood and affect. Her behavior is normal. Judgment  and thought content normal.  Vitals reviewed.   Assessment: s/p :  Cold Knife Cone with Cervical Necrosis  Plan:  Sutures removed from 3 and 9 o'clock position.  Will obtain MRI to help determine if a debridement is needed.  Advised patient to do douching with 1/2 water and 1/2 vinegar to help with discharge. Will follow up on Monday to monitor for healing.  Norco sent for pain.  Asked her to monitor for fevers and if she develops one to call the office to start antibiotics.  Activity as tolerated. Pelvic rest.  Christanna R Schuman 11/18/2017, 10:58 AM

## 2017-11-19 ENCOUNTER — Emergency Department: Payer: Medicaid Other

## 2017-11-19 ENCOUNTER — Other Ambulatory Visit: Payer: Self-pay

## 2017-11-19 ENCOUNTER — Emergency Department
Admission: EM | Admit: 2017-11-19 | Discharge: 2017-11-19 | Disposition: A | Discharge status: Home / Self Care | Payer: Medicaid Other | Attending: Emergency Medicine | Admitting: Emergency Medicine

## 2017-11-19 ENCOUNTER — Encounter: Payer: Self-pay | Admitting: Emergency Medicine

## 2017-11-19 DIAGNOSIS — F17210 Nicotine dependence, cigarettes, uncomplicated: Secondary | ICD-10-CM | POA: Insufficient documentation

## 2017-11-19 DIAGNOSIS — Z79899 Other long term (current) drug therapy: Secondary | ICD-10-CM | POA: Diagnosis not present

## 2017-11-19 DIAGNOSIS — N72 Inflammatory disease of cervix uteri: Secondary | ICD-10-CM | POA: Insufficient documentation

## 2017-11-19 DIAGNOSIS — Z91040 Latex allergy status: Secondary | ICD-10-CM | POA: Diagnosis not present

## 2017-11-19 DIAGNOSIS — Z049 Encounter for examination and observation for unspecified reason: Secondary | ICD-10-CM | POA: Diagnosis present

## 2017-11-19 LAB — CBC WITH DIFFERENTIAL/PLATELET
BASOS ABS: 0.1 10*3/uL (ref 0–0.1)
BASOS PCT: 1 %
Eosinophils Absolute: 0.2 10*3/uL (ref 0–0.7)
Eosinophils Relative: 2 %
HEMATOCRIT: 42.8 % (ref 35.0–47.0)
Hemoglobin: 14.9 g/dL (ref 12.0–16.0)
LYMPHS PCT: 20 %
Lymphs Abs: 1.9 10*3/uL (ref 1.0–3.6)
MCH: 32.2 pg (ref 26.0–34.0)
MCHC: 34.7 g/dL (ref 32.0–36.0)
MCV: 92.6 fL (ref 80.0–100.0)
Monocytes Absolute: 0.5 10*3/uL (ref 0.2–0.9)
Monocytes Relative: 6 %
NEUTROS ABS: 6.7 10*3/uL — AB (ref 1.4–6.5)
Neutrophils Relative %: 71 %
PLATELETS: 133 10*3/uL — AB (ref 150–440)
RBC: 4.62 MIL/uL (ref 3.80–5.20)
RDW: 12.8 % (ref 11.5–14.5)
WBC: 9.3 10*3/uL (ref 3.6–11.0)

## 2017-11-19 LAB — COMPREHENSIVE METABOLIC PANEL
ALBUMIN: 4 g/dL (ref 3.5–5.0)
ALT: 13 U/L (ref 0–44)
AST: 15 U/L (ref 15–41)
Alkaline Phosphatase: 44 U/L (ref 38–126)
Anion gap: 5 (ref 5–15)
BILIRUBIN TOTAL: 0.9 mg/dL (ref 0.3–1.2)
BUN: 11 mg/dL (ref 6–20)
CHLORIDE: 105 mmol/L (ref 98–111)
CO2: 27 mmol/L (ref 22–32)
CREATININE: 0.86 mg/dL (ref 0.44–1.00)
Calcium: 8.8 mg/dL — ABNORMAL LOW (ref 8.9–10.3)
GFR calc Af Amer: >60 mL/min (ref >60)
GLUCOSE: 89 mg/dL (ref 70–99)
Potassium: 4 mmol/L (ref 3.5–5.1)
Sodium: 137 mmol/L (ref 135–145)
Total Protein: 6.7 g/dL (ref 6.5–8.1)

## 2017-11-19 LAB — LACTIC ACID, PLASMA: Lactic Acid, Venous: 1 mmol/L (ref 0.5–1.9)

## 2017-11-19 MED ORDER — GADOBENATE DIMEGLUMINE 529 MG/ML IV SOLN
10.0000 mL | Freq: Once | INTRAVENOUS | Status: AC | PRN
  Administered 2017-11-19: 10 mL via INTRAVENOUS

## 2017-11-19 MED ORDER — METRONIDAZOLE 500 MG PO TABS: 500.0000 mg | ORAL_TABLET | Freq: Two times a day (BID) | ORAL | 0 refills | Status: AC

## 2017-11-19 MED ORDER — METRONIDAZOLE 500 MG PO TABS
500.0000 mg | ORAL_TABLET | Freq: Once | ORAL | Status: AC
  Administered 2017-11-19: 500 mg via ORAL
  Filled 2017-11-19: qty 1

## 2017-11-19 NOTE — ED Triage Notes (Signed)
CONE procedure last Thursday.  Pt went to OBGYN yesterday and OB thinks stitches may be too tight because cervix turning black and pt has had black vaginal discharge.  No fevers. OB wanted MRI but medicaid wouldn't approve so was sent to ED for further evaluation. Pain to cervix per pt.  Reports stitches were cut out yesterday.

## 2017-11-19 NOTE — ED Notes (Signed)
Pt taken to MRI  

## 2017-11-19 NOTE — ED Provider Notes (Signed)
[facility] Emergency Department Provider Note ____________________________________________   First MD Initiated Contact with Patient 11/19/17 2002     (approximate)  I have reviewed the triage vital signs and the nursing notes.   HISTORY  Chief Complaint Post-op Problem  HPI Stassi S Rodena MedinHodgin is a 27 y.o. female with a cone biopsy of the cervix on 822 who was presented to the emergency department for an MRI.  She was seen in the office by Dr. Gaynelle ArabianShuman yesterday who found her cervix to be black and with a black discharge.  She is concerned about necrosis and further inflammatory changes deeper to the site of the cervix.  The patient at this time says that she is a 5 out of 10 pain with minimal black discharge.  Last pain medication was this morning.  No burning with urination.  Possible that this was caused by a tight stitch in the cervix that were removed by Dr. Gaynelle ArabianShuman yesterday in the office.  Past Medical History:  Diagnosis Date  . Anemia   . Anxiety    NO MEDS  . Depression    NO MEDS  . GERD (gastroesophageal reflux disease)     Patient Active Problem List   Diagnosis Date Noted  . S/P cone biopsy of cervix 11/18/2017  . 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 CERVICAL CONIZATION W/BX N/A 11/11/2017   Procedure: CONIZATION CERVIX WITH BIOPSY- COLD KNIFE;  Surgeon: Natale MilchSchuman, Christanna R, MD;  Location: [facility] ORS;  Service: Gynecology;  Laterality: N/A;  . COLONOSCOPY    . ESOPHAGOGASTRODUODENOSCOPY    . WISDOM TOOTH EXTRACTION      Prior to Admission medications   Medication Sig Start Date End Date Taking? Authorizing Provider  etonogestrel (NEXPLANON) 68 MG IMPL implant 1 each by Subdermal route once.    Yes [provider]  HYDROcodone-acetaminophen (NORCO/VICODIN) 5-325 MG tablet Take 1 tablet by mouth every 6 (six) hours as needed for up to 5 days. 11/18/17 11/23/17 Yes Schuman,  Jaquelyn Bitterhristanna R, MD  aspirin-acetaminophen-caffeine (EXCEDRIN MIGRAINE) [id number] MG tablet Take 2 tablets by mouth daily as needed for headache.    [provider]  ibuprofen (ADVIL,MOTRIN) 200 MG tablet Take 600 mg by mouth daily as needed for headache or moderate pain.    [provider]    Allergies Penicillins and Latex  History reviewed. No pertinent family history.  Social History Social History   Tobacco Use  . Smoking status: Current Every Day Smoker    Packs/day: 1.00    Years: 10.00    Pack years: 10.00    Types: Cigarettes  . Smokeless tobacco: Never Used  Substance Use Topics  . Alcohol use: Never    Frequency: Never  . Drug use: Yes    Types: Marijuana    Comment: OCC    Review of Systems  Constitutional: No fever/chills Eyes: No visual changes. ENT: No sore throat. Cardiovascular: Denies chest pain. Respiratory: Denies shortness of breath. Gastrointestinal: No abdominal pain.  No nausea, no vomiting.  No diarrhea.  No constipation. Genitourinary: As above Musculoskeletal: Negative for back pain. Skin: Negative for rash. Neurological: Negative for headaches, focal weakness or numbness.   ____________________________________________   PHYSICAL EXAM:  VITAL SIGNS: ED Triage Vitals  Enc Vitals Group     BP 11/19/17 1705 121/83     Pulse Rate 11/19/17 1705 (!) 53     Resp 11/19/17 1705 18  Temp 11/19/17 1705 98.6 F (37 C)     Temp Source 11/19/17 1705 Oral     SpO2 11/19/17 1705 100 %     Weight 11/19/17 1706 80 lb (36.3 kg)     Height 11/19/17 1706 5\' 9"  (1.753 m)     Head Circumference --      Peak Flow --      Pain Score 11/19/17 1706 4     Pain Loc --      Pain Edu? --      Excl. in GC? --     Constitutional: Alert and oriented. Well appearing and in no acute distress. Eyes: Conjunctivae are normal.  Head: Atraumatic. Nose: No congestion/rhinnorhea. Mouth/Throat: Mucous membranes are moist.  Neck: No stridor.     Cardiovascular: Normal rate, regular rhythm. Grossly normal heart sounds.   Respiratory: Normal respiratory effort.  No retractions. Lungs CTAB. Gastrointestinal: Soft with minimal suprapubic tenderness to palpation without rebound or guarding. No distention.  Musculoskeletal: No lower extremity tenderness nor edema.  No joint effusions. Neurologic:  Normal speech and language. No gross focal neurologic deficits are appreciated. Skin:  Skin is warm, dry and intact. No rash noted. Psychiatric: Mood and affect are normal. Speech and behavior are normal.  ____________________________________________   LABS (all labs ordered are listed, but only abnormal results are displayed)  Labs Reviewed  CBC WITH DIFFERENTIAL/PLATELET - Abnormal; Notable for the following components:      Result Value   Platelets 133 (*)    Neutro Abs 6.7 (*)    All other components within normal limits  COMPREHENSIVE METABOLIC PANEL - Abnormal; Notable for the following components:   Calcium 8.8 (*)    All other components within normal limits  LACTIC ACID, PLASMA  LACTIC ACID, PLASMA   ____________________________________________  EKG   ____________________________________________  RADIOLOGY MRI report: Mild irregularity of the endocervical canal most compatible with recent endocervical curettage and reported history of superficial necrosis. ____________________________________________   PROCEDURES  Procedure(s) performed:   Procedures  Critical Care performed:   ____________________________________________   INITIAL IMPRESSION / ASSESSMENT AND PLAN / ED COURSE  Pertinent labs & imaging results that were available during my care of the patient were reviewed by me and considered in my medical decision making (see chart for details).  Differential diagnosis includes, but is not limited to, ovarian cyst, ovarian torsion, acute appendicitis, diverticulitis, urinary tract  infection/pyelonephritis, endometriosis, bowel obstruction, colitis, renal colic, gastroenteritis, hernia, fibroids, endometriosis, pregnancy related pain including ectopic pregnancy, etc. As part of my medical decision making, I reviewed the following data within the electronic MEDICAL RECORD NUMBER Notes from outpatient visits  ----------------------------------------- 9:23 PM on 11/19/2017 -----------------------------------------  I discussed the case with Dr. Gaynelle Arabian who recommends discharge with Flagyl and follow-up in the office this Wednesday.  She reviewed the MRI results and believes that this is likely a local inflammatory response without systemic issue. ____________________________________________   FINAL CLINICAL IMPRESSION(S) / ED DIAGNOSES  Cervicitis  NEW MEDICATIONS STARTED DURING THIS VISIT:  New Prescriptions   No medications on file     Note:  This document was prepared using Dragon voice recognition software and may include unintentional dictation errors.     Myrna Blazer, MD 11/19/17 2124

## 2017-11-19 NOTE — ED Notes (Signed)
Discussed with dr paduchowski 

## 2017-11-23 ENCOUNTER — Telehealth: Payer: Self-pay | Admitting: Obstetrics and Gynecology

## 2017-11-23 NOTE — Telephone Encounter (Signed)
-----   Message from Natale Milch, MD sent at 11/19/2017  8:40 PM EDT ----- I would like to see this patient in the office on 11/24/17. I asked her to come in at 1:30. I'm okay with double booking, so I can see her. Thank you, Christanna

## 2017-11-23 NOTE — Telephone Encounter (Signed)
Patient reschedule to 11/24/17 at 1:30

## 2017-11-24 ENCOUNTER — Ambulatory Visit (INDEPENDENT_AMBULATORY_CARE_PROVIDER_SITE_OTHER): Payer: Medicaid Other | Admitting: Obstetrics and Gynecology

## 2017-11-24 ENCOUNTER — Ambulatory Visit: Payer: Medicaid Other | Admitting: Obstetrics and Gynecology

## 2017-11-24 ENCOUNTER — Encounter: Payer: Self-pay | Admitting: Obstetrics and Gynecology

## 2017-11-24 VITALS — Ht 69.0 in | Wt 178.0 lb

## 2017-11-24 DIAGNOSIS — Z9889 Other specified postprocedural states: Secondary | ICD-10-CM

## 2017-11-24 DIAGNOSIS — D069 Carcinoma in situ of cervix, unspecified: Secondary | ICD-10-CM

## 2017-11-24 NOTE — Progress Notes (Signed)
  Postoperative Follow-up Patient presents post op from cervical conization for CIN 3, 2 weeks ago.  Subjective: Patient reports some improvement in her preop symptoms. Eating a regular diet without difficulty. Pain is controlled with current analgesics. Medications being used: tramadol and motrin.  Activity: normal activities of daily living. Patient reports vaginal sx's of None  Objective: Ht 5\' 9"  (1.753 m)   Wt 178 lb (80.7 kg)   LMP 10/31/2017 (Exact Date)   BMI 26.29 kg/m  Physical Exam  Constitutional: She is oriented to person, place, and time. She appears well-developed.  Genitourinary: There is no lesion on the right labia. There is no lesion on the left labia. Vagina exhibits no lesion. Right adnexum does not display mass. Left adnexum does not display mass. Cervix does not exhibit motion tenderness.  Genitourinary Comments: Cervix has normal post cone appearance, no black tissue seen. Small bloody discharge on dossil  HENT:  Head: Normocephalic and atraumatic.  Eyes: EOM are normal.  Neck: Neck supple. No thyromegaly present.  Cardiovascular: Normal rate, regular rhythm and normal heart sounds.  Pulmonary/Chest: Effort normal and breath sounds normal. Right breast exhibits no inverted nipple, no mass, no nipple discharge and no skin change. Left breast exhibits no inverted nipple, no mass, no nipple discharge and no skin change.  Abdominal: Soft. Bowel sounds are normal. She exhibits no distension and no mass.  Neurological: She is alert and oriented to person, place, and time.  Skin: Skin is warm and dry.  Psychiatric: She has a normal mood and affect. Her behavior is normal. Judgment and thought content normal.  Vitals reviewed.   Assessment: s/p :  Cervical conization stable  Plan: Patient has done well after surgery with no apparent complications.  I have discussed the post-operative course to date, and the expected progress moving forward.  The patient understands  what complications to be concerned about.  I will see the patient in routine follow up, or sooner if needed.    Activity plan: May return to work in 5 days  Kahlani Graber R Jerene Pitch 11/24/2017, 3:12 PM

## 2017-11-29 ENCOUNTER — Ambulatory Visit: Payer: Medicaid Other | Admitting: Obstetrics and Gynecology

## 2017-12-15 NOTE — Progress Notes (Unsigned)
Copy to [facility]. 

## 2017-12-16 ENCOUNTER — Encounter: Payer: Self-pay | Admitting: Obstetrics and Gynecology

## 2017-12-16 ENCOUNTER — Ambulatory Visit (INDEPENDENT_AMBULATORY_CARE_PROVIDER_SITE_OTHER): Payer: Medicaid Other | Admitting: Obstetrics and Gynecology

## 2017-12-16 VITALS — BP 118/70 | HR 88 | Ht 69.0 in | Wt 179.0 lb

## 2017-12-16 DIAGNOSIS — Z9889 Other specified postprocedural states: Secondary | ICD-10-CM

## 2017-12-16 DIAGNOSIS — D069 Carcinoma in situ of cervix, unspecified: Secondary | ICD-10-CM

## 2017-12-16 NOTE — Progress Notes (Signed)
  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  Subjective: Patient reports marked improvement in her preop symptoms. Eating a regular diet without difficulty. The patient is not having any pain.  Activity: normal activities of daily living. Patient reports additional symptom's since surgery of None.  Objective: BP 118/70   Pulse 88   Ht 5\' 9"  (1.753 m)   Wt 179 lb (81.2 kg)   LMP 12/13/2017 (Exact Date)   BMI 26.43 kg/m  Physical Exam  Constitutional: She is oriented to person, place, and time. She appears well-developed.  Genitourinary: Vagina normal and uterus normal. There is no lesion on the right labia. There is no lesion on the left labia. Vagina exhibits no lesion. Right adnexum does not display mass. Left adnexum does not display mass. Cervix does not exhibit motion tenderness.    HENT:  Head: Normocephalic and atraumatic.  Eyes: EOM are normal.  Neck: Neck supple. No thyromegaly present.  Cardiovascular: Normal rate, regular rhythm and normal heart sounds.  Pulmonary/Chest: Effort normal and breath sounds normal. Right breast exhibits no inverted nipple, no mass, no nipple discharge and no skin change. Left breast exhibits no inverted nipple, no mass, no nipple discharge and no skin change.  Abdominal: Soft. Bowel sounds are normal. She exhibits no distension and no mass.  Neurological: She is alert and oriented to person, place, and time.  Skin: Skin is warm and dry.  Psychiatric: She has a normal mood and affect. Her behavior is normal. Judgment and thought content normal.  Vitals reviewed.   Assessment: s/p :  Cold knife cone stable  Plan: Patient has done well after surgery with no apparent complications.  I have discussed the post-operative course to date, and the expected progress moving forward.  The patient understands what complications to be concerned about.  I will see the patient in routine follow up, or sooner  if needed.    Activity plan: light duty for 2 more weeks then may return to regular work. May resume intercourse in 2 weeks.  Christanna R Schuman 12/16/2017, 3:05 PM

## 2017-12-17 ENCOUNTER — Encounter: Payer: Self-pay | Admitting: Emergency Medicine

## 2017-12-17 ENCOUNTER — Other Ambulatory Visit: Payer: Self-pay

## 2017-12-17 ENCOUNTER — Emergency Department: Payer: Medicaid Other

## 2017-12-17 ENCOUNTER — Emergency Department
Admission: EM | Admit: 2017-12-17 | Discharge: 2017-12-17 | Disposition: A | Discharge status: Home / Self Care | Payer: Medicaid Other | Attending: Emergency Medicine | Admitting: Emergency Medicine

## 2017-12-17 DIAGNOSIS — Z91040 Latex allergy status: Secondary | ICD-10-CM | POA: Insufficient documentation

## 2017-12-17 DIAGNOSIS — Y92000 Kitchen of unspecified non-institutional (private) residence as  the place of occurrence of the external cause: Secondary | ICD-10-CM | POA: Diagnosis not present

## 2017-12-17 DIAGNOSIS — R55 Syncope and collapse: Secondary | ICD-10-CM | POA: Insufficient documentation

## 2017-12-17 DIAGNOSIS — W19XXXA Unspecified fall, initial encounter: Secondary | ICD-10-CM

## 2017-12-17 DIAGNOSIS — F17210 Nicotine dependence, cigarettes, uncomplicated: Secondary | ICD-10-CM | POA: Diagnosis not present

## 2017-12-17 DIAGNOSIS — Z3202 Encounter for pregnancy test, result negative: Secondary | ICD-10-CM | POA: Diagnosis not present

## 2017-12-17 DIAGNOSIS — Y9389 Activity, other specified: Secondary | ICD-10-CM | POA: Insufficient documentation

## 2017-12-17 DIAGNOSIS — R51 Headache: Secondary | ICD-10-CM | POA: Insufficient documentation

## 2017-12-17 DIAGNOSIS — Y999 Unspecified external cause status: Secondary | ICD-10-CM | POA: Diagnosis not present

## 2017-12-17 DIAGNOSIS — R109 Unspecified abdominal pain: Secondary | ICD-10-CM

## 2017-12-17 DIAGNOSIS — R519 Headache, unspecified: Secondary | ICD-10-CM

## 2017-12-17 LAB — URINALYSIS, COMPLETE (UACMP) WITH MICROSCOPIC
Bacteria, UA: NONE SEEN
Bilirubin Urine: NEGATIVE
GLUCOSE, UA: NEGATIVE mg/dL
Ketones, ur: NEGATIVE mg/dL
Leukocytes, UA: NEGATIVE
NITRITE: NEGATIVE
PH: 6 (ref 5.0–8.0)
Protein, ur: NEGATIVE mg/dL
Specific Gravity, Urine: 1.005 (ref 1.005–1.030)

## 2017-12-17 LAB — BASIC METABOLIC PANEL
Anion gap: 5 (ref 5–15)
BUN: 9 mg/dL (ref 6–20)
CALCIUM: 8.7 mg/dL — AB (ref 8.9–10.3)
CO2: 24 mmol/L (ref 22–32)
CREATININE: 0.92 mg/dL (ref 0.44–1.00)
Chloride: 110 mmol/L (ref 98–111)
GLUCOSE: 116 mg/dL — AB (ref 70–99)
Potassium: 4.1 mmol/L (ref 3.5–5.1)
Sodium: 139 mmol/L (ref 135–145)

## 2017-12-17 LAB — CBC
HCT: 42.9 % (ref 35.0–47.0)
Hemoglobin: 15 g/dL (ref 12.0–16.0)
MCH: 32.5 pg (ref 26.0–34.0)
MCHC: 34.9 g/dL (ref 32.0–36.0)
MCV: 93.1 fL (ref 80.0–100.0)
PLATELETS: 127 10*3/uL — AB (ref 150–440)
RBC: 4.61 MIL/uL (ref 3.80–5.20)
RDW: 12.9 % (ref 11.5–14.5)
WBC: 9.3 10*3/uL (ref 3.6–11.0)

## 2017-12-17 LAB — GLUCOSE, CAPILLARY: Glucose-Capillary: 103 mg/dL — ABNORMAL HIGH (ref 70–99)

## 2017-12-17 LAB — POCT PREGNANCY, URINE: Preg Test, Ur: NEGATIVE

## 2017-12-17 NOTE — Discharge Instructions (Addendum)
These establish a primary care physician for your chronic medical needs.  Drink plenty of fluids to stay well-hydrated.  Return to the emergency department if you develop lightheadedness or fainting, severe pain, nausea or vomiting, fever, or any other symptoms concerning to you.

## 2017-12-17 NOTE — ED Notes (Signed)
Pt states she was up cooking at 0100 when she suddenly had sharp abd pain and then a syncopal episode, states she is currently on her menstrual cycle, states hx of frequent syncopal episodes, pt states left sided facial and neck pain from falling, states she woke up this AM with nausea and chest tightness, pt awake and alert in no acute distress

## 2017-12-17 NOTE — ED Triage Notes (Signed)
Pt presnets to ED via POV with c/o syncopal episode at approx 0100 today. Pt c/o L sided facial pain, and neck pain. Pt also c/o "feeling funny". Pt states "my stomach got a sharp pain in it then everything got fuzzy".

## 2017-12-17 NOTE — ED Provider Notes (Addendum)
[facility] Emergency Department Provider Note  ____________________________________________  Time seen: Approximately 2:46 PM  I have reviewed the triage vital signs and the nursing notes.   HISTORY  Chief Complaint Loss of Consciousness    HPI Melissa Glover is a 27 y.o. female is post recent endocervical cone procedure presenting with abdominal pain followed by syncope.  The patient reports a history of recurrent syncope, with at least 10 lifetime episodes usually preceded by pain.  She states she was standing in her kitchen at 1 AM last night when she had an acute self-limited sharp central abdominal pain, and then had blackening of vision.  He felt that she was going to pass out, so she went to her grandmother's recliner, and then woke up on the ground.  She was not confused, and had no urinary or fecal incontinence.  She has mild left-sided facial pain without any swelling, and has not had any nausea or vomiting.  This syncopal episode was typical of her prior fainting spells.  She denies any recent fever, nausea vomiting or diarrhea, and has not had any additional abdominal pain.  She states that she followed up with her OB/GYN, Dr. Gaynelle Arabian, yesterday who cleared her from her cone procedure and stated that she was healing well.  I reviewed the patient's chart, and she was seen 11/19/2017 in the ED for necrotic tissue on the cervix.  Past Medical History:  Diagnosis Date  . Anemia   . Anxiety    NO MEDS  . Depression    NO MEDS  . GERD (gastroesophageal reflux disease)     Patient Active Problem List   Diagnosis Date Noted  . CIN III (cervical intraepithelial neoplasia grade III) with severe dysplasia 12/16/2017  . S/P cone biopsy of cervix 11/18/2017  . 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 CERVICAL CONIZATION W/BX N/A 11/11/2017   Procedure: CONIZATION CERVIX WITH BIOPSY- COLD KNIFE;   Surgeon: Natale Milch, MD;  Location: [facility] ORS;  Service: Gynecology;  Laterality: N/A;  . COLONOSCOPY    . ESOPHAGOGASTRODUODENOSCOPY    . WISDOM TOOTH EXTRACTION      Current Outpatient Rx  . Order #: [id number] Class: Historical Med  . Order #: [id number] Class: Historical Med  . Order #: [id number] Class: Historical Med  . Order #: [id number] Class: Historical Med    Allergies Penicillins and Latex  History reviewed. No pertinent family history.  Social History Social History   Tobacco Use  . Smoking status: Current Every Day Smoker    Packs/day: 1.00    Years: 10.00    Pack years: 10.00    Types: Cigarettes  . Smokeless tobacco: Never Used  Substance Use Topics  . Alcohol use: Never    Frequency: Never  . Drug use: Yes    Types: Marijuana    Comment: OCC    Review of Systems Constitutional: No fever/chills.  Positive lightheadedness with blackening of vision and syncope.  Positive fall from sitting position. Eyes: No blurred or double vision.  The patient did have blackening of vision prior to syncope. ENT: No sore throat. No congestion or rhinorrhea. Cardiovascular: Denies chest pain. Denies palpitations. Respiratory: Denies shortness of breath.  No cough. Gastrointestinal: Positive central sharp abdominal pain.  No nausea, no vomiting.  No diarrhea.  No constipation. Genitourinary: Negative for dysuria.  No new vaginal symptoms. Musculoskeletal: Negative for back pain.  No neck pain.  No extremity injury. Skin:  Negative for rash. Neurological: Negative for headaches. No focal numbness, tingling or weakness.     ____________________________________________   PHYSICAL EXAM:  VITAL SIGNS: ED Triage Vitals  Enc Vitals Group     BP 12/17/17 1218 112/66     Pulse Rate 12/17/17 1218 60     Resp 12/17/17 1218 18     Temp 12/17/17 1218 98.3 F (36.8 C)     Temp Source 12/17/17 1218 Oral     SpO2 12/17/17 1218 100 %     Weight 12/17/17 1218 170 lb (77.1  kg)     Height 12/17/17 1218 5\' 9"  (1.753 m)     Head Circumference --      Peak Flow --      Pain Score 12/17/17 1232 8     Pain Loc --      Pain Edu? --      Excl. in GC? --     Constitutional: Alert and oriented.  Answers questions appropriately.  GCS is 15. Eyes: Conjunctivae are normal.  EOMI. No scleral icterus. Head: Atraumatic.  No obvious swelling or ecchymosis on the face or scalp.  No battle sign. Nose: No congestion/rhinnorhea.  No swelling over the nose.  No septal hematoma. Mouth/Throat: Mucous membranes are moist.  No malocclusion or dental injury. Neck: No stridor.  Supple.  No midline C-spine tenderness to palpation, step-offs or deformities. Cardiovascular: Normal rate, regular rhythm. No murmurs, rubs or gallops.  Respiratory: Normal respiratory effort.  No accessory muscle use or retractions. Lungs CTAB.  No wheezes, rales or ronchi. Gastrointestinal: Soft, nontender and nondistended.  No guarding or rebound.  No peritoneal signs. Musculoskeletal: No LE edema.  Pelvis is stable.  No evidence of extremity injury.  Neurologic:  A&Ox3.  Speech is clear.  Face and smile are symmetric.  EOMI.  Moves all extremities well. Skin:  Skin is warm, dry and intact. No rash noted. Psychiatric: Mood and affect are normal. Speech and behavior are normal.  Normal judgement.  ____________________________________________   LABS (all labs ordered are listed, but only abnormal results are displayed)  Labs Reviewed  BASIC METABOLIC PANEL - Abnormal; Notable for the following components:      Result Value   Glucose, Bld 116 (*)    Calcium 8.7 (*)    All other components within normal limits  CBC - Abnormal; Notable for the following components:   Platelets 127 (*)    All other components within normal limits  URINALYSIS, COMPLETE (UACMP) WITH MICROSCOPIC - Abnormal; Notable for the following components:   Color, Urine STRAW (*)    APPearance CLEAR (*)    Hgb urine dipstick SMALL  (*)    All other components within normal limits  GLUCOSE, CAPILLARY - Abnormal; Notable for the following components:   Glucose-Capillary 103 (*)    All other components within normal limits  CBG MONITORING, ED  POC URINE PREG, ED  POCT PREGNANCY, URINE   ____________________________________________  EKG  ED ECG REPORT I, Anne-Caroline Sharma Covert, the attending physician, personally viewed and interpreted this ECG.   Date: 12/17/2017  EKG Time: 1224  Rate: 62  Rhythm: NSR  Axis: normal  Intervals:none  ST&T Change: No STEMI; no evidence of Brugada syndrome, prolonged QTC or hypertrophy.  Nonspecific T wave inversions in V1 and V2.  ____________________________________________  RADIOLOGY  No results found.  ____________________________________________   PROCEDURES  Procedure(s) performed: None  Procedures  Critical Care performed: No ____________________________________________   INITIAL IMPRESSION / ASSESSMENT AND PLAN /  ED COURSE  Pertinent labs & imaging results that were available during my care of the patient were reviewed by me and considered in my medical decision making (see chart for details).  27 y.o. female with a history of recurrent syncope, presenting with a brief syncopal episode without any red flag symptoms after sharp abdominal pain, which has now resolved.  Overall the patient is well-appearing and hemodynamically stable.  I do not see any evidence of arrhythmia or ischemia today; there is no evidence of Brugada syndrome, prolonged QTC or hypertrophy.  I will get orthostatic vital signs to see if she may have some volume depletion.  She will also be given clear liquids to drink.  The patient's laboratory studies are reassuring with a normal glucose, no evidence of anemia, normal electrolytes, and a negative pregnancy test.  She does not have a UTI.  I am awaiting the results of the patient's orthostatic vital signs, and her chest x-ray; if these are  reassuring, she will be discharged home with instructions to establish a primary care physician at the [facility] clinic.  Return precautions were discussed.  ----------------------------------------- 4:15 PM on 12/17/2017 -----------------------------------------  The patient is not orthostatic.  At this time, the patient will be discharged home.  ____________________________________________  FINAL CLINICAL IMPRESSION(S) / ED DIAGNOSES  Final diagnoses:  Syncope, unspecified syncope type  Left facial pain  Fall, initial encounter  Central abdominal pain         NEW MEDICATIONS STARTED DURING THIS VISIT:  New Prescriptions   No medications on file      Rockne Menghini, MD 12/17/17 1453    Rockne Menghini, MD 12/17/17 1615

## 2017-12-18 ENCOUNTER — Encounter (HOSPITAL_COMMUNITY): Payer: Self-pay | Admitting: Emergency Medicine

## 2017-12-18 ENCOUNTER — Emergency Department (HOSPITAL_COMMUNITY)
Admission: EM | Admit: 2017-12-18 | Discharge: 2017-12-18 | Disposition: A | Discharge status: Home / Self Care | Payer: Medicaid Other | Attending: Emergency Medicine | Admitting: Emergency Medicine

## 2017-12-18 ENCOUNTER — Other Ambulatory Visit: Payer: Self-pay

## 2017-12-18 ENCOUNTER — Emergency Department (HOSPITAL_COMMUNITY): Payer: Medicaid Other

## 2017-12-18 DIAGNOSIS — F17210 Nicotine dependence, cigarettes, uncomplicated: Secondary | ICD-10-CM | POA: Diagnosis not present

## 2017-12-18 DIAGNOSIS — X58XXXD Exposure to other specified factors, subsequent encounter: Secondary | ICD-10-CM | POA: Insufficient documentation

## 2017-12-18 DIAGNOSIS — S060X1D Concussion with loss of consciousness of 30 minutes or less, subsequent encounter: Secondary | ICD-10-CM | POA: Insufficient documentation

## 2017-12-18 DIAGNOSIS — Z79899 Other long term (current) drug therapy: Secondary | ICD-10-CM | POA: Diagnosis not present

## 2017-12-18 DIAGNOSIS — M542 Cervicalgia: Secondary | ICD-10-CM | POA: Insufficient documentation

## 2017-12-18 DIAGNOSIS — R51 Headache: Secondary | ICD-10-CM | POA: Diagnosis present

## 2017-12-18 MED ORDER — ACETAMINOPHEN 500 MG PO TABS
1000.0000 mg | ORAL_TABLET | Freq: Once | ORAL | Status: AC
  Administered 2017-12-18: 1000 mg via ORAL
  Filled 2017-12-18: qty 2

## 2017-12-18 NOTE — ED Triage Notes (Signed)
Pt states yesterday she had a syncopal episode and fell and hit her head and neck. She had a full cardiac work up at [facility] yesterday, but is here today to get her head and neck evaluated. States she has pressure to her head and that "they did not do any scans of my head or neck.

## 2017-12-18 NOTE — ED Notes (Signed)
Patient verbalizes understanding of discharge instructions. Opportunity for questioning and answers were provided. Armband removed by staff, pt discharged from ED ambulatory.   

## 2017-12-18 NOTE — ED Notes (Signed)
Patient transported to CT 

## 2017-12-18 NOTE — ED Provider Notes (Signed)
[facility] EMERGENCY DEPARTMENT Provider Note   CSN: [id number] Arrival date & time: 12/18/17  1043     History   Chief Complaint Chief Complaint  Patient presents with  . Headache  . Neck Pain    HPI Melissa Glover is a 27 y.o. female with a past medical history of anemia, anxiety, multiple syncopal events who presents today for evaluation of head and neck pain.  She was seen yesterday at [facility] after she reportedly had a syncopal event and says that she struck her head with neck pain.  She denies any interventions at home "I didn't want to do anything in case you guys wanted to do something."  She reports that her bilateral under eyes are darker than usual, even with little sleep.  She reports they are more swollen than usual.  No ear pain.    She reports that she was satisfied with her syncopal exam but not with her evaluation for headache and neck pain.    HPI  Past Medical History:  Diagnosis Date  . Anemia   . Anxiety    NO MEDS  . Depression    NO MEDS  . GERD (gastroesophageal reflux disease)     Patient Active Problem List   Diagnosis Date Noted  . CIN III (cervical intraepithelial neoplasia grade III) with severe dysplasia 12/16/2017  . S/P cone biopsy of cervix 11/18/2017  . 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 CERVICAL CONIZATION W/BX N/A 11/11/2017   Procedure: CONIZATION CERVIX WITH BIOPSY- COLD KNIFE;  Surgeon: Natale Milch, MD;  Location: [facility] ORS;  Service: Gynecology;  Laterality: N/A;  . COLONOSCOPY    . ESOPHAGOGASTRODUODENOSCOPY    . WISDOM TOOTH EXTRACTION       OB History    Gravida  2   Para  1   Term      Preterm  1   AB  1   Living  1     SAB  1   TAB      Ectopic      Multiple      Live Births  1            Home Medications    Prior to Admission medications   Medication Sig Start Date End Date Taking?  Authorizing Provider  aspirin-acetaminophen-caffeine (EXCEDRIN MIGRAINE) [phone] MG tablet Take 2 tablets by mouth daily as needed for headache.    [provider]  etonogestrel (NEXPLANON) 68 MG IMPL implant 1 each by Subdermal route once.     [provider]  HYDROcodone-acetaminophen (NORCO/VICODIN) 5-325 MG tablet Take 1 tablet by mouth every 6 (six) hours as needed for moderate pain.    [provider]  ibuprofen (ADVIL,MOTRIN) 200 MG tablet Take 600 mg by mouth daily as needed for headache or moderate pain.    [provider]    Family History No family history on file.  Social History Social History   Tobacco Use  . Smoking status: Current Every Day Smoker    Packs/day: 1.00    Years: 10.00    Pack years: 10.00    Types: Cigarettes  . Smokeless tobacco: Never Used  Substance Use Topics  . Alcohol use: Never    Frequency: Never  . Drug use: Yes    Types: Marijuana    Comment: OCC     Allergies   Penicillins and Latex  Review of Systems Review of Systems  Constitutional: Negative for chills and fever.  HENT:       Ecchymosis under bilateral eyes  Eyes: Negative for pain and visual disturbance.  Gastrointestinal: Negative for nausea and vomiting.  Musculoskeletal: Positive for neck pain and neck stiffness.  Neurological: Positive for headaches. Negative for syncope (None since evlauted at [facility], ).     Physical Exam Updated Vital Signs BP (!) 133/106 (BP Location: Left Arm)   Pulse (!) 55   Temp 98.5 F (36.9 C) (Oral)   Resp 16   LMP 12/13/2017 (Exact Date)   SpO2 99%   Physical Exam  Constitutional: She appears well-developed and well-nourished.  Non-toxic appearance. No distress.  HENT:  Head: Normocephalic and atraumatic.  Mouth/Throat: Oropharynx is clear and moist.  Questionable ecchymosis versus normal dark circles under bilateral eyes.  No battle signs bilaterally.  Eyes: Conjunctivae and EOM are  normal. Right eye exhibits no discharge. Left eye exhibits no discharge. No scleral icterus.  Neck: Normal range of motion. Neck supple.  Diffuse midline neck tenderness to palpation, paraspinal neck tenderness to palpation left side worse than right.  Cardiovascular: Normal rate and regular rhythm.  Pulmonary/Chest: Effort normal. No stridor. No respiratory distress.  Abdominal: She exhibits no distension.  Musculoskeletal: She exhibits no edema or deformity.  Neurological: She is alert. She exhibits normal muscle tone.  Skin: Skin is warm and dry. She is not diaphoretic.  Psychiatric: She has a normal mood and affect. Her behavior is normal.  Nursing note and vitals reviewed.    ED Treatments / Results  Labs (all labs ordered are listed, but only abnormal results are displayed) Labs Reviewed - No data to display  EKG None  Radiology Dg Chest 2 View  Result Date: 12/17/2017 CLINICAL DATA:  Syncopal episode today EXAM: CHEST - 2 VIEW COMPARISON:  None. FINDINGS: The heart size and mediastinal contours are within normal limits. Both lungs are clear. The visualized skeletal structures are unremarkable. IMPRESSION: No active cardiopulmonary disease. Electronically Signed   By: Alcide Clever M.D.   On: 12/17/2017 15:17   Ct Head Wo Contrast  Result Date: 12/18/2017 CLINICAL DATA:  Syncopal episode.  Fell and hit head. EXAM: CT HEAD WITHOUT CONTRAST CT CERVICAL SPINE WITHOUT CONTRAST TECHNIQUE: Multidetector CT imaging of the head and cervical spine was performed following the standard protocol without intravenous contrast. Multiplanar CT image reconstructions of the cervical spine were also generated. COMPARISON:  None. FINDINGS: CT HEAD FINDINGS Brain: No acute intracranial hemorrhage. No focal mass lesion. No CT evidence of acute infarction. No midline shift or mass effect. No hydrocephalus. Basilar cisterns are patent. Vascular: No hyperdense vessel or unexpected calcification. Skull:  Normal. Negative for fracture or focal lesion. Sinuses/Orbits: Paranasal sinuses and mastoid air cells are clear. Orbits are clear. Other: None. CT CERVICAL SPINE FINDINGS Alignment: Normal alignment of the cervical vertebral bodies. Skull base and vertebrae: Normal craniocervical junction. No loss of vertebral body height or disc height. Normal facet articulation. No evidence of fracture. Soft tissues and spinal canal: No prevertebral soft tissue swelling. No perispinal or epidural hematoma. Disc levels:  Unremarkable Upper chest: Clear Other: None IMPRESSION: 1. No intracranial trauma.  Normal brain. 2. No cervical spine fracture Electronically Signed   By: Genevive Bi M.D.   On: 12/18/2017 14:12   Ct Cervical Spine Wo Contrast  Result Date: 12/18/2017 CLINICAL DATA:  Syncopal episode.  Fell and hit head. EXAM: CT HEAD WITHOUT CONTRAST CT CERVICAL SPINE  WITHOUT CONTRAST TECHNIQUE: Multidetector CT imaging of the head and cervical spine was performed following the standard protocol without intravenous contrast. Multiplanar CT image reconstructions of the cervical spine were also generated. COMPARISON:  None. FINDINGS: CT HEAD FINDINGS Brain: No acute intracranial hemorrhage. No focal mass lesion. No CT evidence of acute infarction. No midline shift or mass effect. No hydrocephalus. Basilar cisterns are patent. Vascular: No hyperdense vessel or unexpected calcification. Skull: Normal. Negative for fracture or focal lesion. Sinuses/Orbits: Paranasal sinuses and mastoid air cells are clear. Orbits are clear. Other: None. CT CERVICAL SPINE FINDINGS Alignment: Normal alignment of the cervical vertebral bodies. Skull base and vertebrae: Normal craniocervical junction. No loss of vertebral body height or disc height. Normal facet articulation. No evidence of fracture. Soft tissues and spinal canal: No prevertebral soft tissue swelling. No perispinal or epidural hematoma. Disc levels:  Unremarkable Upper chest:  Clear Other: None IMPRESSION: 1. No intracranial trauma.  Normal brain. 2. No cervical spine fracture Electronically Signed   By: Genevive Bi M.D.   On: 12/18/2017 14:12    Procedures Procedures (including critical care time)  Medications Ordered in ED Medications  acetaminophen (TYLENOL) tablet 1,000 mg (1,000 mg Oral Given 12/18/17 1308)     Initial Impression / Assessment and Plan / ED Course  I have reviewed the triage vital signs and the nursing notes.  Pertinent labs & imaging results that were available during my care of the patient were reviewed by me and considered in my medical decision making (see chart for details).    Patient presents today requesting evaluation of headache and neck pain.  She was seen at [facility] yesterday after a syncopal event where she had a syncopal work-up, however she reports that her head is still hurting, her neck is hurting, and she did not feel like she had this adequately evaluated at [facility].    She expressed concern that the circles under her eyes are significantly darker and more swollen, this raises concern for a basilar skull fracture, and therefore unable to apply [place] head CT rule.  CT head and neck were obtained which did not show any evidence of injuries, suspect that this is musculoskeletal pain and possibly a concussion.  She was given Tylenol in the emergency room to treat her pain, discussed on appropriate outpatient over-the-counter treatment.  Return precautions were discussed with patient who states their understanding.  At the time of discharge patient denied any unaddressed complaints or concerns.  Patient is agreeable for discharge home.   Final Clinical Impressions(s) / ED Diagnoses   Final diagnoses:  Concussion with loss of consciousness of 30 minutes or less, subsequent encounter  Neck pain    ED Discharge Orders    None       Norman Clay 12/18/17 1631    Azalia Bilis, MD 12/18/17  1701

## 2017-12-18 NOTE — Discharge Instructions (Signed)

## 2018-09-26 ENCOUNTER — Telehealth: Payer: Self-pay

## 2018-09-26 NOTE — Telephone Encounter (Signed)
Patient is calling to follow up to see if she needs to be seen here or go to ER. Please advise

## 2018-09-26 NOTE — Telephone Encounter (Signed)
Pt calling c/o major pain in pelvic area where it seems like same area where she had surgery one yr ago.  Would like to be seen.  [phone]

## 2018-09-26 NOTE — Telephone Encounter (Signed)
Do you want to see her.

## 2018-10-04 ENCOUNTER — Ambulatory Visit: Payer: Medicaid Other | Admitting: Obstetrics and Gynecology

## 2018-11-28 ENCOUNTER — Other Ambulatory Visit: Payer: Self-pay | Admitting: Obstetrics and Gynecology

## 2018-12-20 ENCOUNTER — Other Ambulatory Visit: Payer: Self-pay

## 2018-12-20 ENCOUNTER — Encounter: Payer: Self-pay | Admitting: Advanced Practice Midwife

## 2018-12-20 ENCOUNTER — Ambulatory Visit: Payer: Self-pay

## 2018-12-20 ENCOUNTER — Ambulatory Visit (LOCAL_COMMUNITY_HEALTH_CENTER): Payer: Self-pay | Admitting: Advanced Practice Midwife

## 2018-12-20 VITALS — BP 102/68 | Ht 69.0 in | Wt 164.0 lb

## 2018-12-20 DIAGNOSIS — D069 Carcinoma in situ of cervix, unspecified: Secondary | ICD-10-CM

## 2018-12-20 DIAGNOSIS — Z3046 Encounter for surveillance of implantable subdermal contraceptive: Secondary | ICD-10-CM

## 2018-12-20 DIAGNOSIS — Z3009 Encounter for other general counseling and advice on contraception: Secondary | ICD-10-CM

## 2018-12-20 NOTE — Progress Notes (Signed)
   [place] problem visit  [place] Department  Subjective:  Melissa Glover is a 28 y.o.G2P1 smoker being seen today for repeat f/u pap, STD exam, and check Nexplanon placement  Chief Complaint  Patient presents with  . 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  Last sex 11/2017. Nexplanon inserted 07/2017 and questions proper placement.  Smoking 1 ppd  Does the patient have a current or past history of drug use? No   No components found for: HCV]   Health Maintenance Due  Topic Date Due  . TETANUS/TDAP  06/14/2007  . INFLUENZA VACCINE  10/22/2018    ROS  The following portions of the patient's history were reviewed and updated as appropriate: allergies, current medications, past family history, past medical history, past social history, past surgical history and problem list. Problem list updated.   See flowsheet for other program required questions.  Objective:   Vitals:   12/20/18 1321  BP: 102/68  Weight: 164 lb (74.4 kg)  Height: 5\' 9"  (1.753 m)    Physical Exam    Assessment and Plan:  Melissa Glover is a 28 y.o. female presenting to the [facility] Department for a Women's Health problem visit  1. 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 Family planning Treat wet mount per standing orders Immunization nurse consult Please ask pt to apply for Family Planning Waiver - Syphilis Serology, [place] [facility] - HIV [place] [facility] - Chlamydia/Gonorrhea [place] [facility] - WET PREP FOR TRICH, YEAST, CLUE - IGP, Aptima HPV  3. Encounter for surveillance of implantable subdermal contraceptive Nexplanon  properly in place and intact  4.  Smoker 1 ppd Counseled via 5 A's to stop smoking     Return if symptoms worsen or fail to improve.  No future appointments.  Herbie Saxon, CNM

## 2018-12-20 NOTE — Progress Notes (Signed)
In for Pap smear; desires STD screening tests and Nexplanon checked Debera Lat, RN

## 2018-12-21 LAB — WET PREP FOR TRICH, YEAST, CLUE
Trichomonas Exam: NEGATIVE
Yeast Exam: NEGATIVE

## 2018-12-27 ENCOUNTER — Encounter: Payer: Self-pay | Admitting: Advanced Practice Midwife

## 2018-12-27 ENCOUNTER — Telehealth: Payer: Self-pay

## 2018-12-27 DIAGNOSIS — F17200 Nicotine dependence, unspecified, uncomplicated: Secondary | ICD-10-CM | POA: Insufficient documentation

## 2018-12-27 LAB — IGP, APTIMA HPV
HPV Aptima: POSITIVE — AB
PAP Smear Comment: 0

## 2018-12-27 NOTE — Telephone Encounter (Signed)
TC to patient re: need for BCCCP referral.  Patient tearful and anxious. Stated last time that she was seen at [facility], "they wanted to take everything out and I am only 28 years old".  Instructed patient to be looking for the Cancer Center's call. Patient verbalized understanding.   Referral sent to [facility] at [facility] after verified with finance patient doesn't have medicaid or insurance. Aileen Fass, RN

## 2019-01-02 NOTE — Progress Notes (Signed)
Screened for BCCCP eligibility.  Patient has appointment for abnormal pap results with Dr. Amalia Hailey on 01/13/19.  May apply for [facility] related to pap results,  Patient had conization one year ago.

## 2019-01-03 ENCOUNTER — Ambulatory Visit: Payer: Self-pay | Attending: Oncology

## 2019-01-13 ENCOUNTER — Encounter: Payer: Self-pay | Admitting: Obstetrics and Gynecology

## 2019-01-13 ENCOUNTER — Other Ambulatory Visit: Payer: Self-pay

## 2019-01-13 ENCOUNTER — Ambulatory Visit (INDEPENDENT_AMBULATORY_CARE_PROVIDER_SITE_OTHER): Payer: Self-pay | Admitting: Obstetrics and Gynecology

## 2019-01-13 ENCOUNTER — Other Ambulatory Visit: Payer: Self-pay | Admitting: Obstetrics and Gynecology

## 2019-01-13 VITALS — BP 111/75 | HR 86 | Wt 161.2 lb

## 2019-01-13 DIAGNOSIS — Z9889 Other specified postprocedural states: Secondary | ICD-10-CM

## 2019-01-13 DIAGNOSIS — D069 Carcinoma in situ of cervix, unspecified: Secondary | ICD-10-CM

## 2019-01-13 NOTE — Progress Notes (Signed)
Patient comes in today for colposcopy. 

## 2019-01-13 NOTE — Progress Notes (Signed)
Referring Provider:  BCCCP  HPI:  Melissa Glover is a 28 y.o.  [id number]  who presents today for evaluation and management of abnormal cervical cytology.    Of significant note patient does smoke cigarettes.  Dysplasia History: History of CIN-3 status post cold knife conization and subsequent "cervical necrosis".  Her most recent Pap smear revealed CIN 2.   ROS:  Pertinent items are noted in HPI.  OB History  Gravida Para Term Preterm AB Living  2 1   1 1 1   SAB TAB Ectopic Multiple Live Births  1       1    # Outcome Date GA Lbr Len/2nd Weight Sex Delivery Anes PTL Lv  2 SAB           1 Preterm             Past Medical History:  Diagnosis Date  . Anemia   . Anxiety    NO MEDS  . Depression    NO MEDS  . GERD (gastroesophageal reflux disease)     Past Surgical History:  Procedure Laterality Date  . CERVICAL CONIZATION W/BX N/A 11/11/2017   Procedure: CONIZATION CERVIX WITH BIOPSY- COLD KNIFE;  Surgeon: Homero Fellers, MD;  Location: [facility] ORS;  Service: Gynecology;  Laterality: N/A;  . COLONOSCOPY    . ESOPHAGOGASTRODUODENOSCOPY    . WISDOM TOOTH EXTRACTION      SOCIAL HISTORY: Social History   Substance and Sexual Activity  Alcohol Use Never  . Frequency: Never   Social History   Substance and Sexual Activity  Drug Use Yes  . Types: Marijuana   Comment: OCC     Family History  Problem Relation Age of Onset  . Migraines Mother   . Heart disease Father   . Hypertension Father   . Hypercholesterolemia Father   . Breast cancer Maternal Grandmother   . Lung disease Maternal Grandfather   . Heart disease Paternal Grandmother   . Hypertension Paternal Grandmother   . Hyperlipidemia Paternal Grandmother   . Heart disease Paternal Grandfather   . Hypertension Paternal Grandfather   . Hyperlipidemia Paternal Grandfather     ALLERGIES:  Penicillins and Latex  She has a current medication list which includes the following prescription(s):  aspirin-acetaminophen-caffeine, etonogestrel, hydrocodone-acetaminophen, and ibuprofen.  Physical Exam: -Vitals:  BP 111/75   Pulse 86   Wt 161 lb 3.2 oz (73.1 kg)   LMP 12/31/2018   BMI 23.81 kg/m  GEN: WD, WN, NAD.  A+ O x 3, good mood and affect. ABD:  NT, ND.  Soft, no masses.  No hernias noted.   Pelvic:   Vulva: Normal appearance.  No lesions.  Vagina: No lesions or abnormalities noted.  Support: Normal pelvic support.  Urethra No masses tenderness or scarring.  Meatus Normal size without lesions or prolapse.  Cervix: See below.  Anus: Normal exam.  No lesions.  Perineum: Normal exam.  No lesions.        Bimanual   Uterus: Normal size.  Non-tender.  Mobile.  AV.  Adnexae: No masses.  Non-tender to palpation.  Cul-de-sac: Negative for abnormality.   PROCEDURE: 1.  Urine Pregnancy Test:  not done 2.  Colposcopy performed with 4% acetic acid after verbal consent obtained                           -Aceto-white Lesions Location(s): Significant cervical scarring noted making identification of acetowhite change difficult.              -  Biopsy performed at 7 o'clock   What appears to be possible HPV posterior the cervix was sampled by Cytobrush              -ECC indicated and performed: Yes.       -Biopsy sites made hemostatic with pressure and Monsel's solution   -Satisfactory colposcopy: No.    -Evidence of Invasive cervical CA :  NO  ASSESSMENT:  Melissa Glover is a 28 y.o. [id number] here for  1. CIN III (cervical intraepithelial neoplasia grade III) with severe dysplasia   2. H/O cone biopsy of cervix   .  PLAN: 1.  I discussed the grading system of pap smears and HPV high risk viral types.  We will discuss management after colpo results return.  No orders of the defined types were placed in this encounter.          F/U  Return in about 2 weeks (around 01/27/2019) for Colpo f/u.  Brennan Bailey ,MD 01/13/2019,12:39 PM

## 2019-01-23 LAB — PAP LB (LIQUID-BASED)

## 2019-01-23 LAB — ANATOMIC PATHOLOGY REPORT: PDF Image: 0

## 2019-01-27 ENCOUNTER — Encounter: Payer: Self-pay | Admitting: Obstetrics and Gynecology

## 2019-01-27 ENCOUNTER — Other Ambulatory Visit: Payer: Self-pay

## 2019-01-27 ENCOUNTER — Ambulatory Visit (INDEPENDENT_AMBULATORY_CARE_PROVIDER_SITE_OTHER): Payer: Self-pay | Admitting: Obstetrics and Gynecology

## 2019-01-27 VITALS — BP 109/69 | HR 65 | Wt 163.5 lb

## 2019-01-27 DIAGNOSIS — Z9889 Other specified postprocedural states: Secondary | ICD-10-CM

## 2019-01-27 NOTE — Progress Notes (Signed)
Patient comes in today for Colpo results.  

## 2019-01-27 NOTE — Progress Notes (Signed)
HPI:      [name] is a 28 y.o. [phone] who LMP was Patient's last menstrual period was 12/31/2018.  Subjective:   She presents today for colposcopy results.  She has a history of CIN-3 and subsequent LEEP.  Her last Pap smear several months ago was CIN-2. At colposcopy and ectocervical and endocervical biopsy was performed.  Additionally the posterior vagina was sampled using a Cytobrush. Of significant note-patient has quit smoking!!!!!!!!!!!!    Hx: The following portions of the patient's history were reviewed and updated as appropriate:             She  has a past medical history of Anemia, Anxiety, Depression, and GERD (gastroesophageal reflux disease). She does not have any pertinent problems on file. She  has a past surgical history that includes Wisdom tooth extraction; Colonoscopy; Esophagogastroduodenoscopy; and Cervical conization w/bx (N/A, 11/11/2017). Her family history includes Breast cancer in her maternal grandmother; Heart disease in her father, paternal grandfather, and paternal grandmother; Hypercholesterolemia in her father; Hyperlipidemia in her paternal grandfather and paternal grandmother; Hypertension in her father, paternal grandfather, and paternal grandmother; Lung disease in her maternal grandfather; Migraines in her mother. She  reports that she has been smoking cigarettes. She has a 10.00 pack-year smoking history. She has never used smokeless tobacco. She reports current drug use. Drug: Marijuana. She reports that she does not drink alcohol. She has a current medication list which includes the following prescription(s): aspirin-acetaminophen-caffeine, etonogestrel, hydrocodone-acetaminophen, and ibuprofen. She is allergic to penicillins and latex.       Review of Systems:  Review of Systems  Constitutional: Denied constitutional symptoms, night sweats, recent illness, fatigue, fever, insomnia and weight loss.  Eyes: Denied eye symptoms, eye pain,  photophobia, vision change and visual disturbance.  Ears/Nose/Throat/Neck: Denied ear, nose, throat or neck symptoms, hearing loss, nasal discharge, sinus congestion and sore throat.  Cardiovascular: Denied cardiovascular symptoms, arrhythmia, chest pain/pressure, edema, exercise intolerance, orthopnea and palpitations.  Respiratory: Denied pulmonary symptoms, asthma, pleuritic pain, productive sputum, cough, dyspnea and wheezing.  Gastrointestinal: Denied, gastro-esophageal reflux, melena, nausea and vomiting.  Genitourinary: Denied genitourinary symptoms including symptomatic vaginal discharge, pelvic relaxation issues, and urinary complaints.  Musculoskeletal: Denied musculoskeletal symptoms, stiffness, swelling, muscle weakness and myalgia.  Dermatologic: Denied dermatology symptoms, rash and scar.  Neurologic: Denied neurology symptoms, dizziness, headache, neck pain and syncope.  Psychiatric: Denied psychiatric symptoms, anxiety and depression.  Endocrine: Denied endocrine symptoms including hot flashes and night sweats.   Meds:   Current Outpatient Medications on File Prior to Visit  Medication Sig Dispense Refill  . aspirin-acetaminophen-caffeine (EXCEDRIN MIGRAINE) 250-250-65 MG tablet Take 2 tablets by mouth daily as needed for headache.    . etonogestrel (NEXPLANON) 68 MG IMPL implant 1 each by Subdermal route once.     Marland Kitchen HYDROcodone-acetaminophen (NORCO/VICODIN) 5-325 MG tablet Take 1 tablet by mouth every 6 (six) hours as needed for moderate pain.    Marland Kitchen ibuprofen (ADVIL,MOTRIN) 200 MG tablet Take 600 mg by mouth daily as needed for headache or moderate pain.     No current facility-administered medications on file prior to visit.     Objective:     Vitals:   01/27/19 1055  BP: 109/69  Pulse: 65              See colpo results revealing no evidence of dysplasia  Assessment:    [id number] Patient Active Problem List   Diagnosis Date Noted  . Smoker 1 ppd 12/27/2018  .  CIN III (cervical intraepithelial neoplasia grade III) with severe dysplasia 12/16/2017  . S/P cone biopsy of cervix 11/18/2017  . 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 H/O cone biopsy of cervix     Current colposcopy reveals no evidence of dysplasia.  This is excellent news, however it is significantly different than her last Pap smear revealing CIN-2.  I have explained to the patient that it is possible that she has reversed the course of the virus with her own immune system or that the abnormality was not found at colposcopy but still exists.  Patient has quit smoking.   Plan:            1.  Plan repeat colposcopy in 6 months. Orders No orders of the defined types were placed in this encounter.   No orders of the defined types were placed in this encounter.     F/U  Return in about 6 months (around 07/27/2019). I spent 16 minutes involved in the care of this patient of which greater than 50% was spent discussing discussing colposcopy results, immune response, tobacco use and immune response as well as effect of HPV, necessity of follow-up colposcopy because of the discrepancy between Pap and colpo.  All questions answered.  Finis Bud, M.D. 01/27/2019 11:24 AM

## 2019-02-07 NOTE — Telephone Encounter (Signed)
Pt has been seen since.  Message closed.

## 2019-03-31 NOTE — Progress Notes (Signed)
Benign cervical biopsy results reviewd between Dr. Logan Bores and patient. Patient to follow-up with Dr. Logan Bores 07/27/19. No BCCM requred at this time. Copy to [facility]

## 2019-04-20 IMAGING — CT CT CERVICAL SPINE W/O CM
5 of 8 series · 11 of 33 positions shown, 12 images · non-contrast
Comparison: None.

CLINICAL DATA: Syncopal episode.  Fell and hit head.

EXAM:
CT HEAD WITHOUT CONTRAST
CT CERVICAL SPINE WITHOUT CONTRAST
TECHNIQUE: Multidetector CT imaging of the head and cervical spine was
performed following the standard protocol without intravenous
contrast. Multiplanar CT image reconstructions of the cervical spine
were also generated.

[Series 5: head bone · axial · 0.43mm/px · z∈[-214,-158]mm · 2 of 84 slices shown]
[im 28/84  bone]
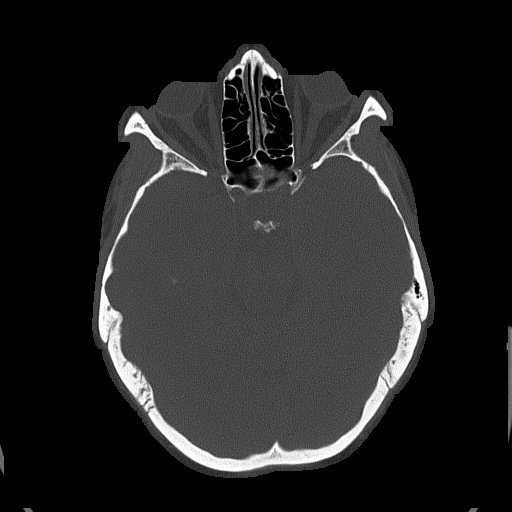
[im 56/84  bone]
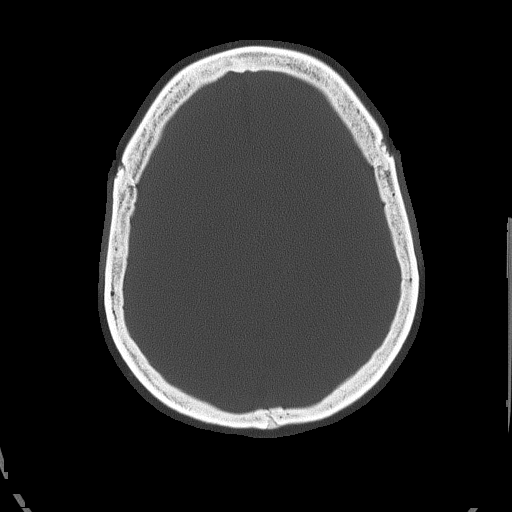

[Series 8: c_spine 2.0 st · axial · 0.35mm/px · z∈[-336,-272]mm · 2 of 98 slices shown, 3 images]
[im 33/98  soft-tissue]
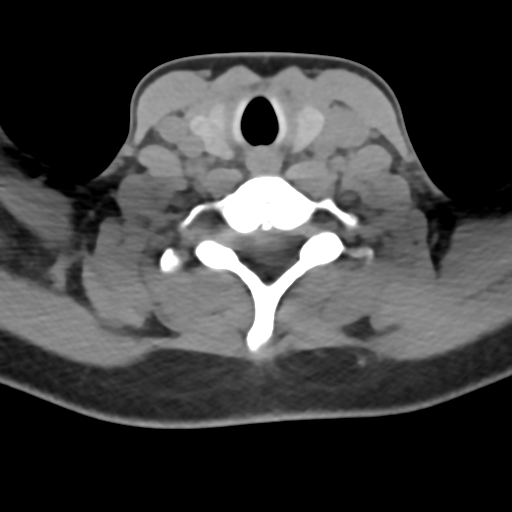
[im 33/98  bone]
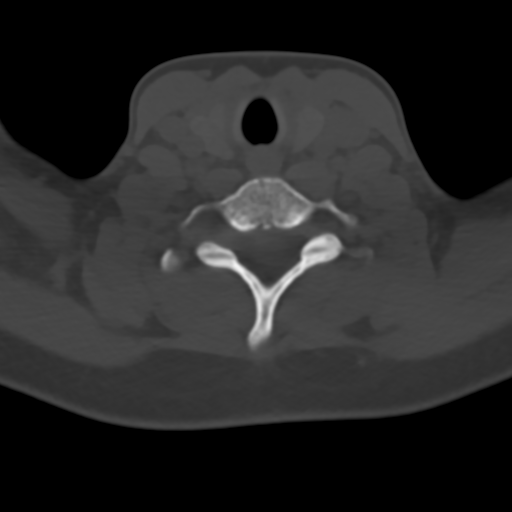
[im 65/98  bone]
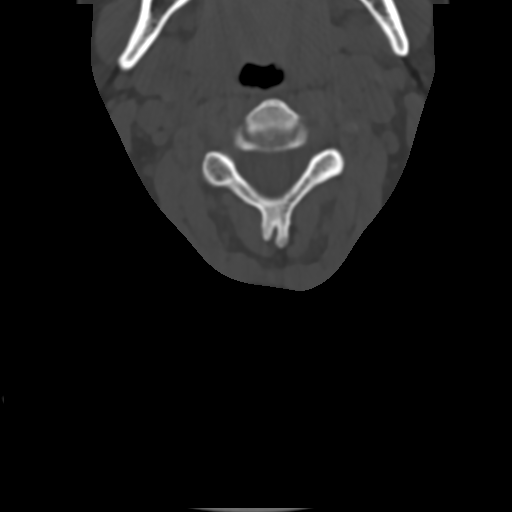

[Series 10: c_spine 2.0 sag bone · sagittal · 0.33mm/px · 4 of 56 slices shown]
[im 12/56  bone]
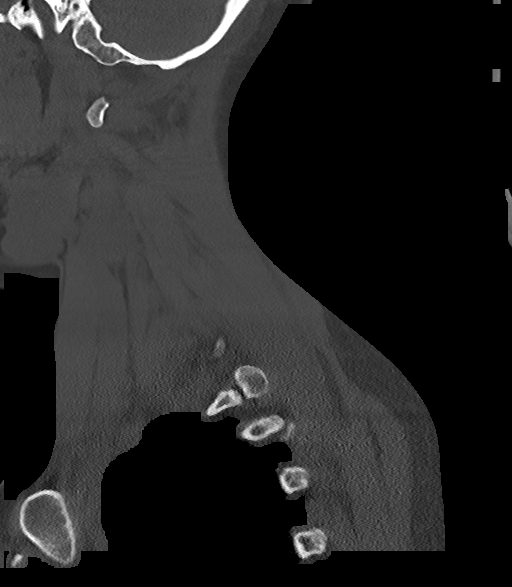
[im 23/56  bone]
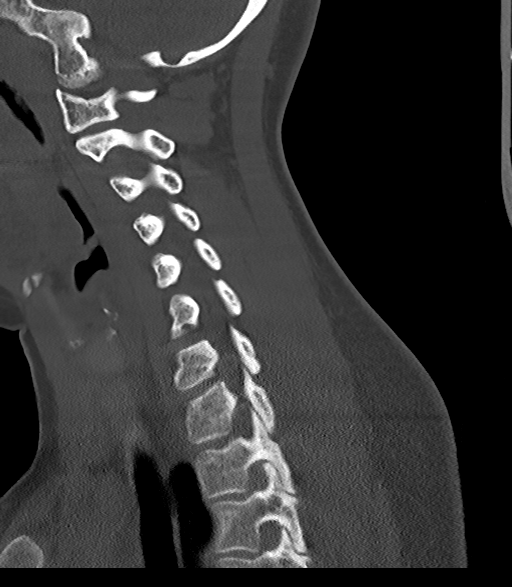
[im 34/56  bone]
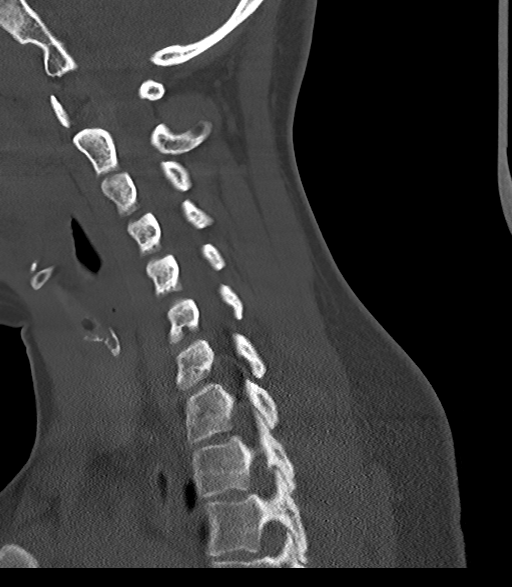
[im 45/56  bone]
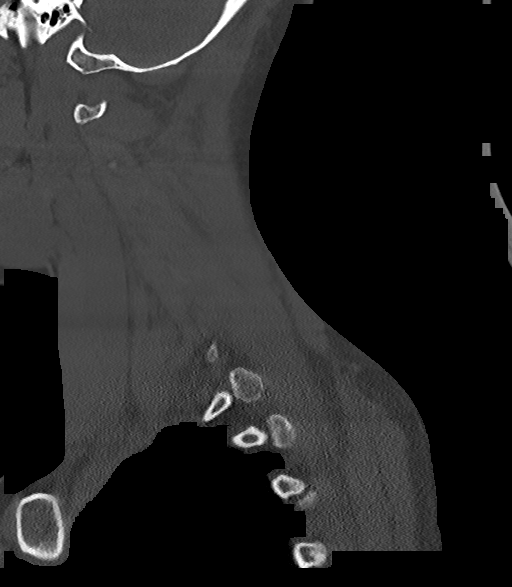

[Series 11: c_spine 2.0 cor bone · coronal · 0.27mm/px · 1 of 75 slices shown]
[im 38/75  bone]
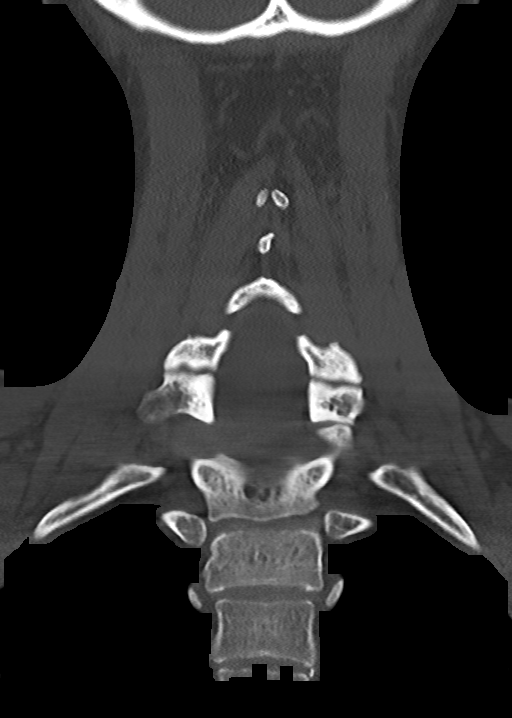

[Series 12: c_spine 2.0 orthogonals · axial · 0.21mm/px · z∈[-345,-296]mm · 2 of 94 slices shown]
[im 32/94  bone]
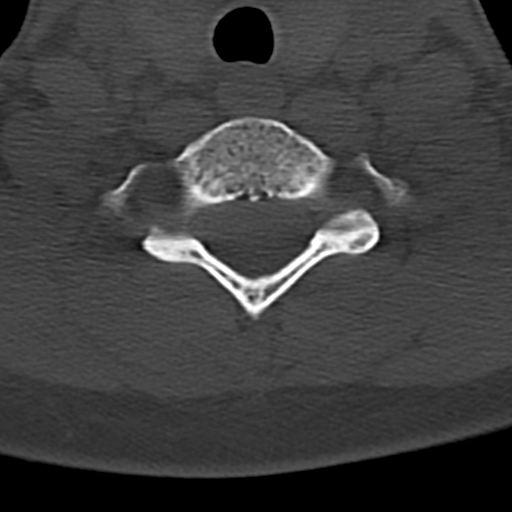
[im 63/94  bone]
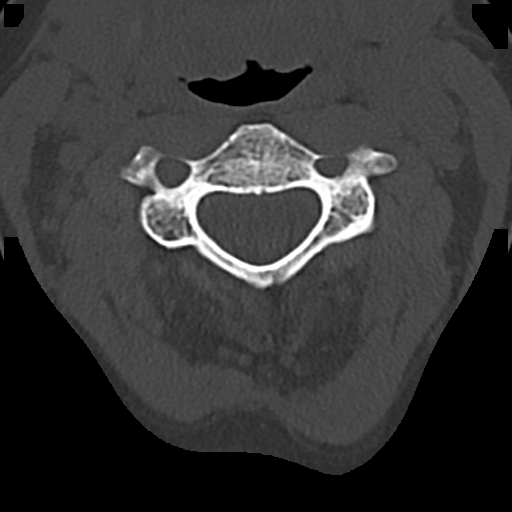

[11 of 33 positions shown; findings below may reference images not displayed]

FINDINGS: CT HEAD FINDINGS

Brain: No acute intracranial hemorrhage. No focal mass lesion. No CT
evidence of acute infarction. No midline shift or mass effect. No
hydrocephalus. Basilar cisterns are patent.

Vascular: No hyperdense vessel or unexpected calcification.

Skull: Normal. Negative for fracture or focal lesion.

Sinuses/Orbits: Paranasal sinuses and mastoid air cells are clear.
Orbits are clear.

Other: None.

CT CERVICAL SPINE FINDINGS

Alignment: Normal alignment of the cervical vertebral bodies.

Skull base and vertebrae: Normal craniocervical junction. No loss of
vertebral body height or disc height. Normal facet articulation. No
evidence of fracture.

Soft tissues and spinal canal: No prevertebral soft tissue swelling.
No perispinal or epidural hematoma.

Disc levels:  Unremarkable

Upper chest: Clear

Other: None
IMPRESSION: 1. No intracranial trauma.  Normal brain.
2. No cervical spine fracture

## 2019-07-27 ENCOUNTER — Encounter: Payer: Self-pay | Admitting: Obstetrics and Gynecology

## 2019-08-04 ENCOUNTER — Ambulatory Visit (INDEPENDENT_AMBULATORY_CARE_PROVIDER_SITE_OTHER): Payer: Self-pay | Admitting: Obstetrics and Gynecology

## 2019-08-04 ENCOUNTER — Encounter: Payer: Self-pay | Admitting: Obstetrics and Gynecology

## 2019-08-04 ENCOUNTER — Other Ambulatory Visit: Payer: Self-pay

## 2019-08-04 ENCOUNTER — Other Ambulatory Visit (HOSPITAL_COMMUNITY)
Admission: RE | Admit: 2019-08-04 | Discharge: 2019-08-04 | Disposition: A | Discharge status: Home / Self Care | Payer: Self-pay | Source: Ambulatory Visit | Attending: Obstetrics and Gynecology | Admitting: Obstetrics and Gynecology

## 2019-08-04 VITALS — BP 119/74 | HR 72 | Ht 69.0 in | Wt 175.7 lb

## 2019-08-04 DIAGNOSIS — Z9889 Other specified postprocedural states: Secondary | ICD-10-CM

## 2019-08-04 NOTE — Addendum Note (Signed)
Addended by: Dorian Pod on: 08/04/2019 01:34 PM   Modules accepted: Orders

## 2019-08-04 NOTE — Progress Notes (Signed)
HPI:  Melissa Glover is a 29 y.o.  [phone]  who presents today for evaluation and management of abnormal cervical cytology.    Dysplasia History:   She presents today for follow-up colposcopy.  The patient has a remote history of CIN-3 and underwent cold knife conization of the cervix.  Her first Pap smear after that revealed continuation of high-grade lesion.  Her last colposcopy showed that this lesion seemingly had spontaneously resolved.  She presents today for another follow-up colposcopy. Patient has Nexplanon for birth control but has 1 to 2 weeks of vaginal bleeding every month.  She is understandably unhappy about this and is considering other forms of birth control.  Of significant note, patient still has continued in her abstinence from tobacco use.!!!!!!!  ROS:  Pertinent items noted in HPI and remainder of comprehensive ROS otherwise negative.  OB History  Gravida Para Term Preterm AB Living  2 1   1 1 1   SAB TAB Ectopic Multiple Live Births  1       1    # Outcome Date GA Lbr Len/2nd Weight Sex Delivery Anes PTL Lv  2 SAB           1 Preterm             Past Medical History:  Diagnosis Date  . Anemia   . Anxiety    NO MEDS  . Depression    NO MEDS  . GERD (gastroesophageal reflux disease)     Past Surgical History:  Procedure Laterality Date  . CERVICAL CONIZATION W/BX N/A 11/11/2017   Procedure: CONIZATION CERVIX WITH BIOPSY- COLD KNIFE;  Surgeon: Homero Fellers, MD;  Location: [facility] ORS;  Service: Gynecology;  Laterality: N/A;  . COLONOSCOPY    . ESOPHAGOGASTRODUODENOSCOPY    . WISDOM TOOTH EXTRACTION      SOCIAL HISTORY: Social History   Substance and Sexual Activity  Alcohol Use Never   Social History   Substance and Sexual Activity  Drug Use Yes  . Types: Marijuana   Comment: OCC     Family History  Problem Relation Age of Onset  . Migraines Mother   . Heart disease Father   . Hypertension Father   . Hypercholesterolemia Father    . Breast cancer Maternal Grandmother   . Lung disease Maternal Grandfather   . Heart disease Paternal Grandmother   . Hypertension Paternal Grandmother   . Hyperlipidemia Paternal Grandmother   . Heart disease Paternal Grandfather   . Hypertension Paternal Grandfather   . Hyperlipidemia Paternal Grandfather     ALLERGIES:  Penicillins and Latex  She has a current medication list which includes the following prescription(s): aspirin-acetaminophen-caffeine, etonogestrel, hydrocodone-acetaminophen, and ibuprofen.  Physical Exam: -Vitals:  BP 119/74   Pulse 72   Ht 5\' 9"  (1.753 m)   Wt 175 lb 11.2 oz (79.7 kg)   BMI 25.95 kg/m   PROCEDURE: Colposcopy performed with 4% acetic acid after verbal consent obtained             Cervix obviously status post procedure.  The acetowhite changes noted below possibly simply represent scarring.  There are no vascular issues.              -Aceto-white Lesions Location(s): 6 o'clock.              -Biopsy performed at 6 o'clock               -ECC indicated and performed: Yes.       -  Biopsy sites made hemostatic with pressure and Monsel's solution   -Satisfactory colposcopy: Yes.      -Evidence of Invasive cervical CA :  NO  ASSESSMENT:  Melissa Glover is a 29 y.o. [id number] here for  1. H/O cone biopsy of cervix   .  PLAN: 1.  I discussed the grading system of pap smears and HPV high risk viral types.  We will discuss management after colpo results return.  No orders of the defined types were placed in this encounter.          F/U  Return for We will contact her with any abnormal test results.  Brennan Bailey ,MD 08/04/2019,11:56 AM

## 2019-08-08 LAB — SURGICAL PATHOLOGY

## 2020-02-06 ENCOUNTER — Encounter: Payer: Self-pay | Admitting: Obstetrics and Gynecology

## 2020-02-28 ENCOUNTER — Telehealth: Payer: Self-pay

## 2020-02-28 NOTE — Telephone Encounter (Signed)
Telephone call to patient today regarding her 02/06/2020 Colpo that was cancelled at [facility].  Left a message to return my call.  Colpo letter mailed today and MyChart message sent to patient as well. Hart Carwin, RN    Return call by patient.  Patient reports having to cancel the Colpo appointment due to finances.  She is having to repeat a Colpo every 6 months and is now in collections for a bill accrued for $200.  She verbalizes concern she cannot afford bills like this and now her credit will be affected by this.   She is very concerned about her GYN health due to not being able to afford the follow-up's required.  I explained to patient I could call BCCCP and see if there is a way to see her thru them for the follow-ups, if not possible referring her to [facility].   Patient was in hopes of continuity of care thru [facility] due to her overall PAP and Colpo history.  I will call her back once I know how to best serve her needs. Hart Carwin, RN

## 2020-04-02 ENCOUNTER — Ambulatory Visit: Payer: Self-pay | Attending: Oncology

## 2020-04-02 DIAGNOSIS — R87610 Atypical squamous cells of undetermined significance on cytologic smear of cervix (ASC-US): Secondary | ICD-10-CM

## 2020-04-02 NOTE — Progress Notes (Unsigned)
Due to Covid 19 pandemic a televisit was used to re-enroll patient into our BCCCP program.  Verbal consent given.  2 patient identifiers used to confirm I was speaking to the correct patient.  Patient's health history obtained.  She has an appointment to see Dr. Logan Bores on 04/09/20.  She is in follow up for a history of CIN3.

## 2020-04-09 ENCOUNTER — Other Ambulatory Visit: Payer: Self-pay

## 2020-04-09 ENCOUNTER — Ambulatory Visit (INDEPENDENT_AMBULATORY_CARE_PROVIDER_SITE_OTHER): Payer: Self-pay | Admitting: Obstetrics and Gynecology

## 2020-04-09 ENCOUNTER — Other Ambulatory Visit: Payer: Self-pay | Admitting: Obstetrics and Gynecology

## 2020-04-09 ENCOUNTER — Encounter: Payer: Self-pay | Admitting: Obstetrics and Gynecology

## 2020-04-09 VITALS — BP 118/87 | HR 80 | Ht 69.0 in | Wt 185.1 lb

## 2020-04-09 DIAGNOSIS — D069 Carcinoma in situ of cervix, unspecified: Secondary | ICD-10-CM

## 2020-04-09 NOTE — Progress Notes (Signed)
Referring Provider:  BCCCP  HPI:  Melissa Glover is a 30 y.o.  [id number]  who presents today for evaluation and management of abnormal cervical cytology.    Dysplasia History: Patient had CIN 3 in the past and underwent excisional biopsy.  She has had 2 colposcopies since that time showing no evidence of dysplasia.  These have been performed because she initially had dysplasia on a Pap after her excisional biopsy.  She presents today for her third follow-up colposcopy. Patient discontinued tobacco use more than 8 months ago. She has Nexplanon for birth control but is not currently sexually active.  ROS:  Pertinent items are noted in HPI.  OB History  Gravida Para Term Preterm AB Living  2 1   1 1 1   SAB IAB Ectopic Multiple Live Births  1       1    # Outcome Date GA Lbr Len/2nd Weight Sex Delivery Anes PTL Lv  2 SAB           1 Preterm             Past Medical History:  Diagnosis Date  . Anemia   . Anxiety    NO MEDS  . Depression    NO MEDS  . GERD (gastroesophageal reflux disease)     Past Surgical History:  Procedure Laterality Date  . CERVICAL CONIZATION W/BX N/A 11/11/2017   Procedure: CONIZATION CERVIX WITH BIOPSY- COLD KNIFE;  Surgeon: 11/13/2017, MD;  Location: [facility] ORS;  Service: Gynecology;  Laterality: N/A;  . COLONOSCOPY    . ESOPHAGOGASTRODUODENOSCOPY    . WISDOM TOOTH EXTRACTION      SOCIAL HISTORY: Social History   Substance and Sexual Activity  Alcohol Use Never   Social History   Substance and Sexual Activity  Drug Use Yes  . Types: Marijuana   Comment: OCC     Family History  Problem Relation Age of Onset  . Migraines Mother   . Heart disease Father   . Hypertension Father   . Hypercholesterolemia Father   . Breast cancer Maternal Grandmother   . Lung disease Maternal Grandfather   . Heart disease Paternal Grandmother   . Hypertension Paternal Grandmother   . Hyperlipidemia Paternal Grandmother   . Heart disease Paternal  Grandfather   . Hypertension Paternal Grandfather   . Hyperlipidemia Paternal Grandfather     ALLERGIES:  Penicillins and Latex  She has a current medication list which includes the following prescription(s): aspirin-acetaminophen-caffeine, etonogestrel, ibuprofen, and hydrocodone-acetaminophen.  Physical Exam: -Vitals:  BP 118/87   Pulse 80   Ht 5\' 9"  (1.753 m)   Wt 185 lb 1.6 oz (84 kg)   LMP 04/01/2020   BMI 27.33 kg/m   PROCEDURE: Colposcopy performed with 4% acetic acid after verbal consent obtained                           -Aceto-white Lesions Location(s): 3 and 8 o'clock.              -Biopsy performed at 3 and 8 o'clock               -ECC indicated and performed: No.     -Biopsy sites made hemostatic with pressure and Monsel's solution   -Satisfactory colposcopy: Yes.      -Evidence of Invasive cervical CA :  NO  ASSESSMENT:  Melissa Glover is a 30 y.o. 05/30/2020 here for No diagnosis found.38  PLAN: 1.  I discussed the grading system of pap smears and HPV high risk viral types.  We will discuss management after colpo results return.   If results abnormal, plan office follow-up visit to discuss management.   If normal results will inform patient via MyChart and recommend Pap follow-up Co -  test in 1 year.  No orders of the defined types were placed in this encounter.          F/U  Return for We will contact her with any abnormal test results.  Brennan Bailey ,MD 04/09/2020,11:21 AM

## 2020-04-10 LAB — ANATOMIC PATHOLOGY REPORT

## 2020-04-23 ENCOUNTER — Telehealth: Payer: Self-pay

## 2020-04-23 NOTE — Telephone Encounter (Signed)
Telephone call to [facility] this morning to inquire about Colpo follow-up with patient.  Path report now final.  Clerical staff reports they will notify the patient once the provider has time to review the lab.  Will follow-up later this week to see if patient has an appointment or a plan of care in place. Hart Carwin, RN

## 2020-04-25 ENCOUNTER — Telehealth: Payer: Self-pay

## 2020-04-25 NOTE — Telephone Encounter (Signed)
Informed patient of Dr Logan Bores instructions.Patient was agreeable. She will call the office to schedule an appointment at her convenience to follow up.

## 2020-05-28 ENCOUNTER — Telehealth: Payer: Self-pay | Admitting: Obstetrics and Gynecology

## 2020-05-28 ENCOUNTER — Telehealth: Payer: Self-pay

## 2020-05-28 NOTE — Telephone Encounter (Signed)
Please advise 

## 2020-05-28 NOTE — Telephone Encounter (Signed)
Spoke to [place] at [facility] today to see what the Colpo f/u plans are.  They have been pending since early February.  Lelon Mast will send back a message to Dr. Logan Bores nurse to have them call with patient plan of care for future related to PAP follow-up. Hart Carwin, RN

## 2020-05-28 NOTE — Telephone Encounter (Signed)
[facility]y with [facility] called inquiring about plan of care for this patient- she said someone can leave message if need be for her.

## 2020-08-09 NOTE — Progress Notes (Signed)
Follow-up colposcopy 09/2020 with Dr. Logan Bores.  Copy to [facility]

## 2020-10-02 ENCOUNTER — Encounter: Payer: Self-pay | Admitting: Obstetrics and Gynecology

## 2020-10-17 ENCOUNTER — Ambulatory Visit (INDEPENDENT_AMBULATORY_CARE_PROVIDER_SITE_OTHER): Payer: Self-pay | Admitting: Obstetrics and Gynecology

## 2020-10-17 ENCOUNTER — Encounter: Payer: Self-pay | Admitting: Obstetrics and Gynecology

## 2020-10-17 ENCOUNTER — Other Ambulatory Visit: Payer: Self-pay | Admitting: Obstetrics and Gynecology

## 2020-10-17 ENCOUNTER — Other Ambulatory Visit: Payer: Self-pay

## 2020-10-17 VITALS — BP 127/80 | HR 91 | Ht 69.0 in | Wt 191.8 lb

## 2020-10-17 DIAGNOSIS — N870 Mild cervical dysplasia: Secondary | ICD-10-CM

## 2020-10-17 NOTE — Progress Notes (Signed)
HPI:  Melissa Glover is a 30 y.o.  [phone]  who presents today for evaluation and management of abnormal cervical cytology.    Dysplasia History: History of CIN-3-underwent conization cold knife.  First few Paps were normal but then she developed dysplasia.  Colposcopy 60-months ago revealed CIN-1.  She presents today for follow-up colposcopy 6 months later.   ROS:  Pertinent items noted in HPI and remainder of comprehensive ROS otherwise negative.  OB History  Gravida Para Term Preterm AB Living  2 1   1 1 1   SAB IAB Ectopic Multiple Live Births  1       1    # Outcome Date GA Lbr Len/2nd Weight Sex Delivery Anes PTL Lv  2 SAB           1 Preterm             Past Medical History:  Diagnosis Date   Anemia    Anxiety    NO MEDS   Depression    NO MEDS   GERD (gastroesophageal reflux disease)     Past Surgical History:  Procedure Laterality Date   CERVICAL CONIZATION W/BX N/A 11/11/2017   Procedure: CONIZATION CERVIX WITH BIOPSY- COLD KNIFE;  Surgeon: 11/13/2017, MD;  Location: [facility] ORS;  Service: Gynecology;  Laterality: N/A;   COLONOSCOPY     ESOPHAGOGASTRODUODENOSCOPY     WISDOM TOOTH EXTRACTION      SOCIAL HISTORY:  Social History   Substance and Sexual Activity  Alcohol Use Never    Social History   Substance and Sexual Activity  Drug Use Yes   Types: Marijuana   Comment: OCC     Family History  Problem Relation Age of Onset   Migraines Mother    Heart disease Father    Hypertension Father    Hypercholesterolemia Father    Breast cancer Maternal Grandmother    Lung disease Maternal Grandfather    Heart disease Paternal Grandmother    Hypertension Paternal Grandmother    Hyperlipidemia Paternal Grandmother    Heart disease Paternal Grandfather    Hypertension Paternal Grandfather    Hyperlipidemia Paternal Grandfather     ALLERGIES:  Penicillins and Latex  She has a current medication list which includes the following  prescription(s): aspirin-acetaminophen-caffeine, etonogestrel, ibuprofen, and hydrocodone-acetaminophen.  Physical Exam: -Vitals:  BP 127/80   Pulse 91   Ht 5\' 9"  (1.753 m)   Wt 191 lb 12.8 oz (87 kg)   BMI 28.32 kg/m   PROCEDURE: Colposcopy performed with 4% acetic acid after verbal consent obtained                           -Aceto-white Lesions Location(s): 1 and 7 o'clock.              -Biopsy performed at 1 and 7 o'clock               -ECC indicated and performed: No     -Biopsy sites made hemostatic with pressure and Monsel's solution   -Satisfactory colposcopy: Yes   -Evidence of Invasive cervical CA :  NO  ASSESSMENT:  Melissa Glover is a 30 y.o. here for No diagnosis found.34  PLAN: 1.  I discussed the grading system of pap smears and HPV high risk viral types.  We will discuss management after colpo results return.  No orders of the defined types were placed in this encounter.  F/U  Return for We will contact her with any abnormal test results.  Brennan Bailey ,MD 10/17/2020,12:30 PM

## 2020-10-21 LAB — ANATOMIC PATHOLOGY REPORT

## 2020-11-14 ENCOUNTER — Ambulatory Visit (LOCAL_COMMUNITY_HEALTH_CENTER): Payer: Self-pay | Admitting: Physician Assistant

## 2020-11-14 ENCOUNTER — Encounter: Payer: Self-pay | Admitting: Physician Assistant

## 2020-11-14 ENCOUNTER — Other Ambulatory Visit: Payer: Self-pay

## 2020-11-14 ENCOUNTER — Ambulatory Visit: Payer: Self-pay

## 2020-11-14 VITALS — BP 102/69 | Ht 69.0 in | Wt 184.0 lb

## 2020-11-14 DIAGNOSIS — Z3009 Encounter for other general counseling and advice on contraception: Secondary | ICD-10-CM

## 2020-11-14 DIAGNOSIS — D069 Carcinoma in situ of cervix, unspecified: Secondary | ICD-10-CM

## 2020-11-14 DIAGNOSIS — Z3046 Encounter for surveillance of implantable subdermal contraceptive: Secondary | ICD-10-CM

## 2020-11-14 DIAGNOSIS — Z720 Tobacco use: Secondary | ICD-10-CM

## 2020-11-14 DIAGNOSIS — Z0000 Encounter for general adult medical examination without abnormal findings: Secondary | ICD-10-CM

## 2020-11-14 NOTE — Progress Notes (Signed)
Patient here for PE and Nexplanon removal. Has been following up on abnormal Pap with [facility] and states she was just told she should come back in 1 year ( instead of 6 mos). Desires Nexplanon removal today, inserted 08/08/2017 and states she is not currently sexually active and wants to give her body a rest from birth control.Marland KitchenMarland KitchenBurt Knack, RN

## 2020-11-14 NOTE — Progress Notes (Signed)
[facility] DEPARTMENT [facility] [address] Main Number: [phone]    Family Planning Visit- Initial Visit  Subjective:  Melissa Glover is a 30 y.o.  [phone]   being seen today for an initial annual visit and to discuss contraceptive options.  The patient is currently using Nexplanon for pregnancy prevention. Patient reports she does not want a pregnancy in the next year.  Patient has the following medical conditions has S/P cone biopsy of cervix 11/11/17; CIN III (cervical intraepithelial neoplasia grade III) with severe dysplasia; and Smoker 1 ppd on their problem list.  Chief Complaint  Patient presents with   Annual Exam   Contraception    Nexplanon removal    Patient reports she had recent f/u with gyn (cervical cone bx with neg result, and was told she will need next Pap 10/2021). Is not sexually active, and has no plans to be. Has had 3 consecutive Nexplanons and wants to "give her body a rest". Has switched from smoking 1ppd to vaping. Not planning to quit in near future.   Patient denies any concerns.   Body mass index is 27.17 kg/m. - Patient is eligible for diabetes screening based on BMI and age >74?  no HA1C ordered? not applicable  Patient reports 0  partner/s in last year. Desires STI screening?  No - low risk  Has patient been screened once for HCV in the past?  No  No results found for: HCVAB  Does the patient have current drug use (including MJ), have a partner with drug use, and/or has been incarcerated since last result? No  If yes-- Screen for HCV through [facility]   Does the patient meet criteria for HBV testing? No  Criteria:  -Household, sexual or needle sharing contact with HBV -History of drug use -HIV positive -Those with known Hep C   Health Maintenance Due  Topic Date Due   COVID-19 Vaccine (1) Never done   Pneumococcal Vaccine 32-44 Years old (1 - PCV) Never done   Hepatitis C Screening  Never  done   TETANUS/TDAP  Never done   INFLUENZA VACCINE  10/21/2020    Review of Systems  Eyes: Negative.   Respiratory: Negative.    Cardiovascular: Negative.   Gastrointestinal: Negative.   Genitourinary:        Neg except cervical issues.  Neurological:  Positive for loss of consciousness.       States she faints "for no reason" at times, usually feels this coming on and is able to sit down prior. 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  Endo/Heme/Allergies: Negative.    The following portions of the patient's history were reviewed and updated as appropriate: allergies, current medications, past family history, past medical history, past social history, past surgical history and problem list. Problem list updated.   See flowsheet for other program required questions.  Objective:   Vitals:   11/14/20 1524  BP: 102/69  Weight: 184 lb (83.5 kg)  Height: 5\' 9"  (1.753 m)    Physical Exam Constitutional:      Appearance: Normal appearance.  HENT:     Head: Normocephalic and atraumatic.  Neck:     Thyroid: No thyroid mass, thyromegaly or thyroid tenderness.  Pulmonary:     Effort: Pulmonary effort is normal.  Abdominal:     Palpations: Abdomen is soft.  Musculoskeletal:        General: Normal range of motion.  Lymphadenopathy:     Cervical:  No cervical adenopathy.  Skin:    General: Skin is warm and dry.  Neurological:     General: No focal deficit present.     Mental Status: She is alert.  Psychiatric:        Mood and Affect: Mood normal.        Behavior: Behavior normal.   Declines pelvic exam/not indicated.   Assessment and Plan:  Melissa Glover is a 29 y.o. female presenting to the [facility] Department for an initial annual wellness/contraceptive visit  Contraception counseling: Reviewed all forms of birth control options in the tiered based approach. available including abstinence; over the counter/barrier methods; hormonal contraceptive  medication including pill, patch, ring, injection,contraceptive implant, ECP; hormonal and nonhormonal IUDs; permanent sterilization options including vasectomy and the various tubal sterilization modalities. Risks, benefits, and typical effectiveness rates were reviewed.  Questions were answered.  Written information was also given to the patient to review.  Patient desires Nexplanon removal and to continue abstinence. She will follow up in  12 mo for surveillance.  She was told to call with any further questions, or with any concerns about this method of contraception.  Emphasized use of condoms 100% of the time for STI prevention.  Nexplanon Removal Patient identified, informed consent performed, consent signed.   Appropriate time out taken. Nexplanon site identified in medial L arm.  Area prepped in usual sterile fashon. 2 ml of 1% lidocaine with Epinephrine was used to anesthetize the area at the distal end of the implant and along implant site. A small stab incision was made right beside the implant on the distal portion.  The Nexplanon rod was grasped using hemostats and removed without difficulty.  There was minimal blood loss. There were no complications.  Steri-strips were applied over the small incision.  A pressure bandage was applied to reduce any bruising.  The patient tolerated the procedure well and was given post procedure instructions.     1. Family planning services See below.  2. Well woman exam without gynecological exam Enc pt to take MVI with folic acid daily. Enc to est PCP and dentist.  3. Nexplanon removal Nexplanon removed at pt request.  4. CIN III (cervical intraepithelial neoplasia grade III) with severe dysplasia F/u with gynecologist as planned for Pap in 10/2021.  5. Tobacco abuse Enc vaping cessation. Pt in pre-contemplation phase. Has [place] [employer]uit[employer] info.     Return in about 1 year (around 11/14/2021) for Annual well-woman exam.  No future  appointments.  Landry Dyke, PA-C

## 2020-11-14 NOTE — Progress Notes (Signed)
See office visit note for annual well woman exam and Nexplanon removal documentation.
